# Patient Record
Sex: Male | Born: 1970 | Race: White | Hispanic: No | Marital: Single | Smoking: Former smoker
Health system: Southern US, Community
[De-identification: ages and names within clinical notes are randomized; demographics above are authoritative.]

## PROBLEM LIST (undated history)

## (undated) DIAGNOSIS — Z86010 Personal history of colonic polyps: Principal | ICD-10-CM

## (undated) DIAGNOSIS — K219 Gastro-esophageal reflux disease without esophagitis: Secondary | ICD-10-CM

## (undated) DIAGNOSIS — M722 Plantar fascial fibromatosis: Secondary | ICD-10-CM

## (undated) DIAGNOSIS — M549 Dorsalgia, unspecified: Secondary | ICD-10-CM

## (undated) DIAGNOSIS — K602 Anal fissure, unspecified: Secondary | ICD-10-CM

## (undated) DIAGNOSIS — Z9109 Other allergy status, other than to drugs and biological substances: Secondary | ICD-10-CM

## (undated) HISTORY — DX: Other allergy status, other than to drugs and biological substances: Z91.09

## (undated) HISTORY — DX: Anal fissure, unspecified: K60.2

## (undated) HISTORY — DX: Personal history of colonic polyps: Z86.010

## (undated) HISTORY — DX: Dorsalgia, unspecified: M54.9

## (undated) HISTORY — DX: Gastro-esophageal reflux disease without esophagitis: K21.9

## (undated) HISTORY — DX: Plantar fascial fibromatosis: M72.2

## (undated) HISTORY — PX: COLONOSCOPY: SHX174

---

## 2002-08-18 ENCOUNTER — Ambulatory Visit (HOSPITAL_COMMUNITY): Admission: RE | Admit: 2002-08-18 | Discharge: 2002-08-18 | Payer: Self-pay | Admitting: Gastroenterology

## 2011-12-19 LAB — PSA

## 2012-07-07 ENCOUNTER — Encounter: Payer: Self-pay | Admitting: Internal Medicine

## 2013-03-03 ENCOUNTER — Encounter: Payer: Self-pay | Admitting: Internal Medicine

## 2014-11-20 DIAGNOSIS — Z800 Family history of malignant neoplasm of digestive organs: Secondary | ICD-10-CM | POA: Insufficient documentation

## 2014-11-20 DIAGNOSIS — E785 Hyperlipidemia, unspecified: Secondary | ICD-10-CM | POA: Insufficient documentation

## 2014-11-22 ENCOUNTER — Encounter: Payer: Self-pay | Admitting: Unknown Physician Specialty

## 2014-11-22 ENCOUNTER — Ambulatory Visit (INDEPENDENT_AMBULATORY_CARE_PROVIDER_SITE_OTHER): Payer: BLUE CROSS/BLUE SHIELD | Admitting: Unknown Physician Specialty

## 2014-11-22 VITALS — BP 142/90 | HR 70 | Temp 98.9°F | Ht 70.5 in | Wt 216.2 lb

## 2014-11-22 DIAGNOSIS — Z800 Family history of malignant neoplasm of digestive organs: Secondary | ICD-10-CM | POA: Diagnosis not present

## 2014-11-22 DIAGNOSIS — M545 Low back pain: Secondary | ICD-10-CM

## 2014-11-22 DIAGNOSIS — Z23 Encounter for immunization: Secondary | ICD-10-CM

## 2014-11-22 DIAGNOSIS — E785 Hyperlipidemia, unspecified: Secondary | ICD-10-CM

## 2014-11-22 DIAGNOSIS — Z0000 Encounter for general adult medical examination without abnormal findings: Secondary | ICD-10-CM

## 2014-11-22 MED ORDER — CYCLOBENZAPRINE HCL 10 MG PO TABS: 10.0000 mg | ORAL_TABLET | Freq: Every day | ORAL | Status: DC

## 2014-11-22 MED ORDER — PRAVASTATIN SODIUM 40 MG PO TABS: 40.0000 mg | ORAL_TABLET | Freq: Every day | ORAL | Status: DC

## 2014-11-22 NOTE — Patient Instructions (Signed)
DASH Eating Plan  DASH stands for "Dietary Approaches to Stop Hypertension." The DASH eating plan is a healthy eating plan that has been shown to reduce high blood pressure (hypertension). Additional health benefits may include reducing the risk of type 2 diabetes mellitus, heart disease, and stroke. The DASH eating plan may also help with weight loss.  WHAT DO I NEED TO KNOW ABOUT THE DASH EATING PLAN?  For the DASH eating plan, you will follow these general guidelines:  · Choose foods with a percent daily value for sodium of less than 5% (as listed on the food label).  · Use salt-free seasonings or herbs instead of table salt or sea salt.  · Check with your health care provider or pharmacist before using salt substitutes.  · Eat lower-sodium products, often labeled as "lower sodium" or "no salt added."  · Eat fresh foods.  · Eat more vegetables, fruits, and low-fat dairy products.  · Choose whole grains. Look for the word "whole" as the first word in the ingredient list.  · Choose fish and skinless chicken or turkey more often than red meat. Limit fish, poultry, and meat to 6 oz (170 g) each day.  · Limit sweets, desserts, sugars, and sugary drinks.  · Choose heart-healthy fats.  · Limit cheese to 1 oz (28 g) per day.  · Eat more home-cooked food and less restaurant, buffet, and fast food.  · Limit fried foods.  · Cook foods using methods other than frying.  · Limit canned vegetables. If you do use them, rinse them well to decrease the sodium.  · When eating at a restaurant, ask that your food be prepared with less salt, or no salt if possible.  WHAT FOODS CAN I EAT?  Seek help from a dietitian for individual calorie needs.  Grains  Whole grain or whole wheat bread. Brown rice. Whole grain or whole wheat pasta. Quinoa, bulgur, and whole grain cereals. Low-sodium cereals. Corn or whole wheat flour tortillas. Whole grain cornbread. Whole grain crackers. Low-sodium crackers.  Vegetables  Fresh or frozen vegetables  (raw, steamed, roasted, or grilled). Low-sodium or reduced-sodium tomato and vegetable juices. Low-sodium or reduced-sodium tomato sauce and paste. Low-sodium or reduced-sodium canned vegetables.   Fruits  All fresh, canned (in natural juice), or frozen fruits.  Meat and Other Protein Products  Ground beef (85% or leaner), grass-fed beef, or beef trimmed of fat. Skinless chicken or turkey. Ground chicken or turkey. Pork trimmed of fat. All fish and seafood. Eggs. Dried beans, peas, or lentils. Unsalted nuts and seeds. Unsalted canned beans.  Dairy  Low-fat dairy products, such as skim or 1% milk, 2% or reduced-fat cheeses, low-fat ricotta or cottage cheese, or plain low-fat yogurt. Low-sodium or reduced-sodium cheeses.  Fats and Oils  Tub margarines without trans fats. Light or reduced-fat mayonnaise and salad dressings (reduced sodium). Avocado. Safflower, olive, or canola oils. Natural peanut or almond butter.  Other  Unsalted popcorn and pretzels.  The items listed above may not be a complete list of recommended foods or beverages. Contact your dietitian for more options.  WHAT FOODS ARE NOT RECOMMENDED?  Grains  White bread. White pasta. White rice. Refined cornbread. Bagels and croissants. Crackers that contain trans fat.  Vegetables  Creamed or fried vegetables. Vegetables in a cheese sauce. Regular canned vegetables. Regular canned tomato sauce and paste. Regular tomato and vegetable juices.  Fruits  Dried fruits. Canned fruit in light or heavy syrup. Fruit juice.  Meat and Other Protein   Products  Fatty cuts of meat. Ribs, chicken wings, bacon, sausage, bologna, salami, chitterlings, fatback, hot dogs, bratwurst, and packaged luncheon meats. Salted nuts and seeds. Canned beans with salt.  Dairy  Whole or 2% milk, cream, half-and-half, and cream cheese. Whole-fat or sweetened yogurt. Full-fat cheeses or blue cheese. Nondairy creamers and whipped toppings. Processed cheese, cheese spreads, or cheese  curds.  Condiments  Onion and garlic salt, seasoned salt, table salt, and sea salt. Canned and packaged gravies. Worcestershire sauce. Tartar sauce. Barbecue sauce. Teriyaki sauce. Soy sauce, including reduced sodium. Steak sauce. Fish sauce. Oyster sauce. Cocktail sauce. Horseradish. Ketchup and mustard. Meat flavorings and tenderizers. Bouillon cubes. Hot sauce. Tabasco sauce. Marinades. Taco seasonings. Relishes.  Fats and Oils  Butter, stick margarine, lard, shortening, ghee, and bacon fat. Coconut, palm kernel, or palm oils. Regular salad dressings.  Other  Pickles and olives. Salted popcorn and pretzels.  The items listed above may not be a complete list of foods and beverages to avoid. Contact your dietitian for more information.  WHERE CAN I FIND MORE INFORMATION?  National Heart, Lung, and [employer] Institute: www.nhlbi.nih.gov/health/health-topics/topics/dash/     This information is not intended to replace advice given to you by your health care provider. Make sure you discuss any questions you have with your health care provider.     Document Released: 01/09/2011 Document Revised: 02/10/2014 Document Reviewed: 11/24/2012  Elsevier Interactive Patient Education ©2016 Elsevier Inc.

## 2014-11-22 NOTE — Progress Notes (Signed)
BP 142/90 mmHg  Pulse 70  Temp(Src) 98.9 F (37.2 C)  Ht 5' 10.5" (1.791 m)  Wt 216 lb 3.2 oz (98.068 kg)  BMI 30.57 kg/m2  SpO2 97%   Subjective:    Patient ID: George Chang, male    DOB: 04/07/1970, 44 y.o.   MRN: [id number]  HPI: George Chang is a 44 y.o. male  Chief Complaint  Patient presents with  . Establish Care   Pt is here to re establish care.  He had been on Prvastatin in the past and hasn't been back.  He recently went to a work health screen and had a cholesterol of over 500.  He did not bring in the paper today.  He states high cholesterol runs in the family.  No heart disease that he knows of. No chest pain or SOB.  Wants to restart Pravachol as he had "no side effects" from that medication.  This has been a concern for quite some time.  Planning on losing weight.     His dad died at 65 of colon cancer.  He did get a colonoscopy about 7 years ago and is pretty sure he is due for another.  He would like to go to [place].      Relevant past medical, surgical, family and social history reviewed and updated as indicated. Interim medical history since our last visit reviewed. Allergies and medications reviewed and updated.  Review of Systems  Constitutional: Negative.   HENT: Negative.   Eyes: Negative.   Respiratory: Negative.   Cardiovascular: Negative.   Gastrointestinal: Negative.   Endocrine: Negative.   Genitourinary: Negative.   Musculoskeletal: Positive for back pain.       Pulled back at work lifting a lot of things.  States it gets stiff after sitting.  This has been going off and on for about a week but seems to be chronic in nature but pt not wanting to try PT at this time.   Skin: Negative.   Allergic/Immunologic: Negative.   Neurological: Negative.   Hematological: Negative.   Psychiatric/Behavioral: Negative.     Per HPI unless specifically indicated above     Objective:    BP 142/90 mmHg  Pulse 70  Temp(Src) 98.9 F (37.2  C)  Ht 5' 10.5" (1.791 m)  Wt 216 lb 3.2 oz (98.068 kg)  BMI 30.57 kg/m2  SpO2 97%  Wt Readings from Last 3 Encounters:  11/22/14 216 lb 3.2 oz (98.068 kg)  12/19/11 203 lb (92.08 kg)    Physical Exam  Constitutional: He is oriented to person, place, and time. He appears well-developed and well-nourished.  HENT:  Head: Normocephalic.  Eyes: Pupils are equal, round, and reactive to light.  Cardiovascular: Normal rate, regular rhythm and normal heart sounds.   Pulmonary/Chest: Effort normal.  Abdominal: Soft. Bowel sounds are normal.  Musculoskeletal: Normal range of motion.  Neurological: He is alert and oriented to person, place, and time. He has normal reflexes.  Skin: Skin is warm and dry.  Psychiatric: He has a normal mood and affect. His behavior is normal. Judgment and thought content normal.    Results for orders placed or performed in visit on 11/20/14  PSA  Result Value Ref Range   PSA from PP       Assessment & Plan:   Problem List Items Addressed This Visit      Unprioritized   Hyperlipidemia   Relevant Orders   TSH    Other Visit  Diagnoses    Immunization due    -  Primary    Relevant Orders    Flu Vaccine QUAD 36+ mos PF IM (Fluarix & Fluzone Quad PF) (Completed)    Annual physical exam        Relevant Orders    CBC    Comprehensive metabolic panel    TSH    Lipid Panel Piccolo, Waived    PSA    HIV antibody    Low back pain without sciatica, unspecified back pain laterality        Rx for Flexeril    Relevant Medications    ibuprofen (ADVIL,MOTRIN) 200 MG tablet    cyclobenzaprine (FLEXERIL) 10 MG tablet        Follow up plan: No Follow-up on file.

## 2014-11-22 NOTE — Assessment & Plan Note (Signed)
Will start Pravastatin and check cholesterol today as a baseline.  Sounds like familial hypercholesteroemia

## 2014-11-23 LAB — COMPREHENSIVE METABOLIC PANEL
ALBUMIN: 4.6 g/dL (ref 3.5–5.5)
ALK PHOS: 77 IU/L (ref 39–117)
ALT: 64 IU/L — ABNORMAL HIGH (ref 0–44)
AST: 38 IU/L (ref 0–40)
Albumin/Globulin Ratio: 1.6 (ref 1.1–2.5)
BILIRUBIN TOTAL: 0.5 mg/dL (ref 0.0–1.2)
BUN / CREAT RATIO: 15 (ref 9–20)
BUN: 17 mg/dL (ref 6–24)
CHLORIDE: 100 mmol/L (ref 97–106)
CO2: 23 mmol/L (ref 18–29)
Calcium: 9.3 mg/dL (ref 8.7–10.2)
Creatinine, Ser: 1.14 mg/dL (ref 0.76–1.27)
GFR calc Af Amer: 89 mL/min/{1.73_m2} (ref >59)
GFR calc non Af Amer: 77 mL/min/{1.73_m2} (ref >59)
GLOBULIN, TOTAL: 2.9 g/dL (ref 1.5–4.5)
GLUCOSE: 93 mg/dL (ref 65–99)
Potassium: 3.9 mmol/L (ref 3.5–5.2)
SODIUM: 139 mmol/L (ref 136–144)
Total Protein: 7.5 g/dL (ref 6.0–8.5)

## 2014-11-23 LAB — CBC
HEMATOCRIT: 40.6 % (ref 37.5–51.0)
HEMOGLOBIN: 14.5 g/dL (ref 12.6–17.7)
MCH: 30.2 pg (ref 26.6–33.0)
MCHC: 35.7 g/dL (ref 31.5–35.7)
MCV: 85 fL (ref 79–97)
Platelets: 180 10*3/uL (ref 150–379)
RBC: 4.8 x10E6/uL (ref 4.14–5.80)
RDW: 14 % (ref 12.3–15.4)
WBC: 5.4 10*3/uL (ref 3.4–10.8)

## 2014-11-23 LAB — LIPID PANEL W/O CHOL/HDL RATIO
CHOLESTEROL TOTAL: 269 mg/dL — AB (ref 100–199)
HDL: 33 mg/dL — ABNORMAL LOW (ref >39)
LDL CALC: 158 mg/dL — AB (ref 0–99)
TRIGLYCERIDES: 390 mg/dL — AB (ref 0–149)
VLDL CHOLESTEROL CAL: 78 mg/dL — AB (ref 5–40)

## 2014-11-23 LAB — TSH: TSH: 2.79 u[IU]/mL (ref 0.450–4.500)

## 2014-11-23 LAB — PSA: Prostate Specific Ag, Serum: 0.5 ng/mL (ref 0.0–4.0)

## 2014-11-23 LAB — HIV ANTIBODY (ROUTINE TESTING W REFLEX): HIV Screen 4th Generation wRfx: NONREACTIVE

## 2014-11-23 LAB — SPECIMEN STATUS REPORT

## 2014-11-27 ENCOUNTER — Telehealth: Payer: Self-pay | Admitting: Family Medicine

## 2014-11-27 NOTE — Telephone Encounter (Signed)
Please let him know that his labs were normal except for his cholesterol, which was elevated as expected. He should start his pravastatin and George Chang will recheck it at his follow up appointment.

## 2014-11-27 NOTE — Telephone Encounter (Signed)
Patient called back and notified. Patient had no other questions.

## 2014-11-27 NOTE — Telephone Encounter (Signed)
Called patient to let him know results. No answer. Left VM for patient to return my call.

## 2014-12-07 ENCOUNTER — Encounter: Payer: Self-pay | Admitting: Internal Medicine

## 2014-12-19 ENCOUNTER — Other Ambulatory Visit: Payer: Self-pay | Admitting: Unknown Physician Specialty

## 2015-01-24 ENCOUNTER — Ambulatory Visit (AMBULATORY_SURGERY_CENTER): Payer: Self-pay

## 2015-01-24 ENCOUNTER — Telehealth: Payer: Self-pay | Admitting: Unknown Physician Specialty

## 2015-01-24 VITALS — Ht 71.0 in | Wt 220.2 lb

## 2015-01-24 DIAGNOSIS — Z1211 Encounter for screening for malignant neoplasm of colon: Secondary | ICD-10-CM

## 2015-01-24 NOTE — Telephone Encounter (Signed)
Call pt   Expand All Collapse All   Called patient and he states that he was put on pravastatin at his last visit, about a month and a half ago. States he was put on it a few years ago and did not have any issues so he wanted to start back on it. He states that ever since he has started it, his stools have been extremely hard to the point where they are ripping his rectum. Patient states he is supposed to have colonoscopy pre-op appointment today and doesn't know if the issue he has going on will effect it. Patient states he has thought about taking a stool softener but doesn't like taking extra pills. Patient wants to know if he can be switched to something different or what he should do. [facility]acy is [facility] on [address].

## 2015-01-24 NOTE — Progress Notes (Signed)
No allergies to eggs or soy No home oxygen No past problems with anesthesia No diet/weight loss meds  Has email and internet; refused emmi

## 2015-01-24 NOTE — Telephone Encounter (Signed)
Pt would like a call back about a complication he is having, didn't give details sorry.

## 2015-01-24 NOTE — Telephone Encounter (Signed)
Called patient and he states that he was put on pravastatin at his last visit, about a month and a half ago. States he was put on it a few years ago and did not have any issues so he wanted to start back on it. He states that ever since he has started it, his stools have been extremely hard to the point where they are ripping his rectum. Patient states he is supposed to have colonoscopy pre-op appointment today and doesn't know if the issue he has going on will effect it. Patient states he has thought about taking a stool softener but doesn't like taking extra pills. Patient wants to know if he can be switched to something different or what he should do. [facility] [address].

## 2015-02-07 ENCOUNTER — Encounter: Payer: Self-pay | Admitting: Internal Medicine

## 2015-02-07 ENCOUNTER — Ambulatory Visit (AMBULATORY_SURGERY_CENTER): Payer: BLUE CROSS/BLUE SHIELD | Admitting: Internal Medicine

## 2015-02-07 VITALS — BP 119/75 | HR 57 | Temp 96.3°F | Resp 50 | Ht 71.0 in | Wt 220.0 lb

## 2015-02-07 DIAGNOSIS — K601 Chronic anal fissure: Secondary | ICD-10-CM

## 2015-02-07 DIAGNOSIS — D124 Benign neoplasm of descending colon: Secondary | ICD-10-CM | POA: Diagnosis not present

## 2015-02-07 DIAGNOSIS — Z1211 Encounter for screening for malignant neoplasm of colon: Secondary | ICD-10-CM | POA: Diagnosis present

## 2015-02-07 DIAGNOSIS — Z800 Family history of malignant neoplasm of digestive organs: Secondary | ICD-10-CM | POA: Diagnosis not present

## 2015-02-07 MED ORDER — DILTIAZEM GEL 2 %: 1.0000 "application " | Freq: Two times a day (BID) | CUTANEOUS | Status: DC

## 2015-02-07 MED ORDER — SODIUM CHLORIDE 0.9 % IV SOLN: 500.0000 mL | INTRAVENOUS | Status: DC

## 2015-02-07 NOTE — Op Note (Signed)
[facility]  [address] [place], [id number]   COLONOSCOPY PROCEDURE REPORT  PATIENT: George Chang, George Chang  MR#: [id number] BIRTHDATE: 09-18-1970 , 44  yrs. old GENDER: male ENDOSCOPIST: Gatha Mayer, MD, [facility] PROCEDURE DATE:  02/07/2015 PROCEDURE:   Colonoscopy, screening and Colonoscopy with snare polypectomy First Screening Colonoscopy - Avg.  risk and is 50 yrs.  old or older - No.  Prior Negative Screening - Now for repeat screening. 10 or more years since last screening  History of Adenoma - Now for follow-up colonoscopy & has been > or = to 3 yrs.  N/A  Polyps removed today? Yes ASA CLASS:   Class II INDICATIONS:Screening for colonic neoplasia and FH Colon or Rectal Adenocarcinoma. MEDICATIONS: Propofol 350 mg IV and Monitored anesthesia care  DESCRIPTION OF PROCEDURE:   After the risks benefits and alternatives of the procedure were thoroughly explained, informed consent was obtained.  The digital rectal exam revealed no prostatic nodules, revealed the prostate was not enlarged, and revealed a chronic anal fissure.   The LB [id number] endoscope was introduced through the anus and advanced to the cecum, which was identified by both the appendix and ileocecal valve. No adverse events experienced.   The quality of the prep was excellent.  (MiraLax was used)  The instrument was then slowly withdrawn as the colon was fully examined. Estimated blood loss is zero unless otherwise noted in this procedure report.      COLON FINDINGS: A smooth sessile polyp measuring 8 mm in size was found in the descending colon.  A polypectomy was performed with a cold snare.  The resection was complete, the polyp tissue was completely retrieved and sent to histology.   The examination was otherwise normal.   An anal fissure was found in the anal canal. Retroflexed views revealed no abnormalities. The time to cecum = 5.5 Withdrawal time = 15.2   The scope was  withdrawn and the procedure completed. COMPLICATIONS: There were no immediate complications.  ENDOSCOPIC IMPRESSION: 1.   Sessile polyp was found in the descending colon; polypectomy was performed with a cold snare 2.   The examination was otherwise normal 3.   Anal fissure in the anal canal  RECOMMENDATIONS: 1.  Timing of repeat colonoscopy will be determined by pathology findings. 2.  Diltiazem gel 2% bid to fissure 3.  Benefiber 2 tbsp a day for fissure 4.  If fissure not resolved after treatment x 3-4 months (take diltiazem for 1 month after feeling well) would see a surgeon  eSigned:  Gatha Mayer, MD, [facility] 02/07/2015 2:19 PM   cc: The Patient and Kathrine Haddock, NP

## 2015-02-07 NOTE — Progress Notes (Signed)
Called to room to assist during endoscopic procedure.  Patient ID and intended procedure confirmed with present staff. Received instructions for my participation in the procedure from the performing physician.  

## 2015-02-07 NOTE — Progress Notes (Signed)
Pt. Unable to pass enough air to become comfortable.  He is distended, firm and uncomfortable.  Dr. Carlean Purl in to pass rectal tube.  Pt. Did pass Air with tube.  Dr. Carlean Purl approves of discharge.

## 2015-02-07 NOTE — Progress Notes (Signed)
Patient denies any allergies to eggs or soy. 

## 2015-02-07 NOTE — Patient Instructions (Addendum)
I found and removed one polyp.  You do have an anal fissure - treat that with stool softeners/fiber - and diltiazem gel. Prescription needs to be taken to a compounding pharmacy. I think [facility] in [place] can do this.  I will let you know pathology results and when to have another routine colonoscopy by mail.  I appreciate the opportunity to care for you. Gatha Mayer, MD, FACG  YOU HAD AN ENDOSCOPIC PROCEDURE TODAY AT [facility]:   Refer to the procedure report that was given to you for any specific questions about what was found during the examination.  If the procedure report does not answer your questions, please call your gastroenterologist to clarify.  If you requested that your care partner not be given the details of your procedure findings, then the procedure report has been included in a sealed envelope for you to review at your convenience later.  YOU SHOULD EXPECT: Some feelings of bloating in the abdomen. Passage of more gas than usual.  Walking can help get rid of the air that was put into your GI tract during the procedure and reduce the bloating. If you had a lower endoscopy (such as a colonoscopy or flexible sigmoidoscopy) you may notice spotting of blood in your stool or on the toilet paper. If you underwent a bowel prep for your procedure, you may not have a normal bowel movement for a few days.  Please Note:  You might notice some irritation and congestion in your nose or some drainage.  This is from the oxygen used during your procedure.  There is no need for concern and it should clear up in a day or so.  SYMPTOMS TO REPORT IMMEDIATELY:   Following lower endoscopy (colonoscopy or flexible sigmoidoscopy):  Excessive amounts of blood in the stool  Significant tenderness or worsening of abdominal pains  Swelling of the abdomen that is new, acute  Fever of 100F or higher   Following upper endoscopy (EGD)  Vomiting of blood or coffee  ground material  New chest pain or pain under the shoulder blades  Painful or persistently difficult swallowing  New shortness of breath  Fever of 100F or higher  Black, tarry-looking stools  For urgent or emergent issues, a gastroenterologist can be reached at any hour by calling [phone].   DIET: Your first meal following the procedure should be a small meal and then it is ok to progress to your normal diet. Heavy or fried foods are harder to digest and may make you feel nauseous or bloated.  Likewise, meals heavy in dairy and vegetables can increase bloating.  Drink plenty of fluids but you should avoid alcoholic beverages for 24 hours.  ACTIVITY:  You should plan to take it easy for the rest of today and you should NOT DRIVE or use heavy machinery until tomorrow (because of the sedation medicines used during the test).    FOLLOW UP: Our staff will call the number listed on your records the next business day following your procedure to check on you and address any questions or concerns that you may have regarding the information given to you following your procedure. If we do not reach you, we will leave a message.  However, if you are feeling well and you are not experiencing any problems, there is no need to return our call.  We will assume that you have returned to your regular daily activities without incident.  If any biopsies  were taken you will be contacted by phone or by letter within the next 1-3 weeks.  Please call us at [phone] if you have not heard about the biopsies in 3 weeks.    SIGNATURES/CONFIDENTIALITY: You and/or your care partner have signed paperwork which will be entered into your electronic medical record.  These signatures attest to the fact that that the information above on your After Visit Summary has been reviewed and is understood.  Full responsibility of the confidentiality of this discharge information lies with you and/or your  care-partner.  Polyp information given. Anal fissure information given.  Benefiber 2 tablespoons a day for fiber. Diltiazem gel twice a day for anal fissure.  If fissure not resolved after treatment and 3-4 months (take diltiazem for 1 month sfter feeling well) you may want to see a surgeon.  Await pathology report on polyp.

## 2015-02-07 NOTE — Progress Notes (Signed)
A/ox3 pleased with MAC, report to Jane RN 

## 2015-02-08 ENCOUNTER — Telehealth: Payer: Self-pay | Admitting: *Deleted

## 2015-02-08 NOTE — Telephone Encounter (Signed)
  Follow up Call-  Call back number 02/07/2015  Post procedure Call Back phone  # [phone]  Permission to leave phone message Yes     Patient questions:  Do you have a fever, pain , or abdominal swelling? No. Pain Score  0 *  Have you tolerated food without any problems? Yes.    Have you been able to return to your normal activities? Yes.    Do you have any questions about your discharge instructions: Diet   No. Medications  No. Follow up visit  No.  Do you have questions or concerns about your Care? No.  Actions: * If pain score is 4 or above: No action needed, pain <4.

## 2015-02-14 ENCOUNTER — Encounter: Payer: Self-pay | Admitting: Internal Medicine

## 2015-02-14 DIAGNOSIS — K602 Anal fissure, unspecified: Secondary | ICD-10-CM | POA: Insufficient documentation

## 2015-02-14 DIAGNOSIS — Z860100 Personal history of colon polyps, unspecified: Secondary | ICD-10-CM

## 2015-02-14 HISTORY — DX: Anal fissure, unspecified: K60.2

## 2015-02-14 HISTORY — DX: Personal history of colon polyps, unspecified: Z86.0100

## 2015-02-14 HISTORY — DX: Personal history of colonic polyps: Z86.010

## 2015-02-14 NOTE — Progress Notes (Signed)
Quick Note:  8 mm ssp Repeat colon 2022 ______

## 2015-05-25 ENCOUNTER — Encounter: Payer: Self-pay | Admitting: Unknown Physician Specialty

## 2015-05-25 ENCOUNTER — Ambulatory Visit (INDEPENDENT_AMBULATORY_CARE_PROVIDER_SITE_OTHER): Payer: BLUE CROSS/BLUE SHIELD | Admitting: Unknown Physician Specialty

## 2015-05-25 VITALS — BP 126/84 | HR 69 | Temp 98.6°F | Ht 70.2 in | Wt 216.6 lb

## 2015-05-25 DIAGNOSIS — E785 Hyperlipidemia, unspecified: Secondary | ICD-10-CM

## 2015-05-25 MED ORDER — ATORVASTATIN CALCIUM 10 MG PO TABS: 10.0000 mg | ORAL_TABLET | Freq: Every day | ORAL | Status: DC

## 2015-05-25 NOTE — Assessment & Plan Note (Signed)
Trial of Atorvastatin 10 mg QD.

## 2015-05-25 NOTE — Progress Notes (Signed)
BP 126/84 mmHg  Pulse 69  Temp(Src) 98.6 F (37 C)  Ht 5' 10.2" (1.783 m)  Wt 216 lb 9.6 oz (98.249 kg)  BMI 30.90 kg/m2  SpO2 95%   Subjective:    Patient ID: George Chang, male    DOB: [birth year], 45 y.o.   MRN: [id number]  HPI: George Chang is a 45 y.o. male  Chief Complaint  Patient presents with  . Hyperlipidemia    Hyperlipidemia Not taking cholesterol medications.  He states he is not taking the cholesterol medications but it made his stools hard and developed a rectal fissure.  He tried it twice.  He is willing to try a different one.  He wonders if taking fish oil or flaxseed  Family History  Problem Relation Age of Onset  . Cancer Father     colon  . Colon cancer Father 50    expired at 75 as well  . Hyperlipidemia Mother      Relevant past medical, surgical, family and social history reviewed and updated as indicated. Interim medical history since our last visit reviewed. Allergies and medications reviewed and updated.  Review of Systems  Constitutional: Negative.   HENT: Negative.   Eyes: Negative.   Respiratory: Negative.   Cardiovascular: Negative.   Gastrointestinal: Negative.   Endocrine: Negative.   Genitourinary: Negative.   Skin: Negative.   Allergic/Immunologic: Negative.   Neurological: Negative.   Hematological: Negative.   Psychiatric/Behavioral: Negative.     Per HPI unless specifically indicated above     Objective:    BP 126/84 mmHg  Pulse 69  Temp(Src) 98.6 F (37 C)  Ht 5' 10.2" (1.783 m)  Wt 216 lb 9.6 oz (98.249 kg)  BMI 30.90 kg/m2  SpO2 95%  Wt Readings from Last 3 Encounters:  05/25/15 216 lb 9.6 oz (98.249 kg)  02/07/15 220 lb (99.791 kg)  01/24/15 220 lb 3.2 oz (99.882 kg)    Physical Exam  Constitutional: He is oriented to person, place, and time. He appears well-developed and well-nourished. No distress.  HENT:  Head: Normocephalic and atraumatic.  Eyes: Conjunctivae and lids are normal. Right eye  exhibits no discharge. Left eye exhibits no discharge. No scleral icterus.  Cardiovascular: Normal rate.   Pulmonary/Chest: Effort normal.  Abdominal: Normal appearance. There is no splenomegaly or hepatomegaly.  Musculoskeletal: Normal range of motion.  Neurological: He is alert and oriented to person, place, and time.  Skin: Skin is intact. No rash noted. No pallor.  Psychiatric: He has a normal mood and affect. His behavior is normal. Judgment and thought content normal.    Results for orders placed or performed in visit on 11/22/14  CBC  Result Value Ref Range   WBC 5.4 3.4 - 10.8 x10E3/uL   RBC 4.80 4.14 - 5.80 x10E6/uL   Hemoglobin 14.5 12.6 - 17.7 g/dL   Hematocrit 40.6 37.5 - 51.0 %   MCV 85 79 - 97 fL   MCH 30.2 26.6 - 33.0 pg   MCHC 35.7 31.5 - 35.7 g/dL   RDW 14.0 12.3 - 15.4 %   Platelets 180 150 - 379 x10E3/uL  Comprehensive metabolic panel  Result Value Ref Range   Glucose 93 65 - 99 mg/dL   BUN 17 6 - 24 mg/dL   Creatinine, Ser 1.14 0.76 - 1.27 mg/dL   GFR calc non Af Amer 77 >59 mL/min/1.73   GFR calc Af Amer 89 >59 mL/min/1.73   BUN/Creatinine Ratio 15 9 - 20  Sodium 139 136 - 144 mmol/L   Potassium 3.9 3.5 - 5.2 mmol/L   Chloride 100 97 - 106 mmol/L   CO2 23 18 - 29 mmol/L   Calcium 9.3 8.7 - 10.2 mg/dL   Total Protein 7.5 6.0 - 8.5 g/dL   Albumin 4.6 3.5 - 5.5 g/dL   Globulin, Total 2.9 1.5 - 4.5 g/dL   Albumin/Globulin Ratio 1.6 1.1 - 2.5   Bilirubin Total 0.5 0.0 - 1.2 mg/dL   Alkaline Phosphatase 77 39 - 117 IU/L   AST 38 0 - 40 IU/L   ALT 64 (H) 0 - 44 IU/L  TSH  Result Value Ref Range   TSH 2.790 0.450 - 4.500 uIU/mL  PSA  Result Value Ref Range   Prostate Specific Ag, Serum 0.5 0.0 - 4.0 ng/mL  HIV antibody  Result Value Ref Range   HIV Screen 4th Generation wRfx Non Reactive Non Reactive  Specimen status report  Result Value Ref Range   specimen status report Comment   Lipid Panel w/o Chol/HDL Ratio  Result Value Ref Range    Cholesterol, Total 269 (H) 100 - 199 mg/dL   Triglycerides 390 (H) 0 - 149 mg/dL   HDL 33 (L) >39 mg/dL   VLDL Cholesterol Cal 78 (H) 5 - 40 mg/dL   LDL Calculated 158 (H) 0 - 99 mg/dL      Assessment & Plan:   Problem List Items Addressed This Visit      Unprioritized   Hyperlipidemia - Primary    Trial of Atorvastatin 10 mg QD.        Relevant Medications   atorvastatin (LIPITOR) 10 MG tablet      Follow up plan: Return in about 3 months (around 08/24/2015).

## 2015-08-08 ENCOUNTER — Ambulatory Visit (INDEPENDENT_AMBULATORY_CARE_PROVIDER_SITE_OTHER): Payer: Worker's Compensation

## 2015-08-08 ENCOUNTER — Ambulatory Visit
Admission: EM | Admit: 2015-08-08 | Discharge: 2015-08-08 | Disposition: A | Discharge status: Home / Self Care | Payer: Worker's Compensation | Attending: Family Medicine | Admitting: Family Medicine

## 2015-08-08 DIAGNOSIS — M25521 Pain in right elbow: Secondary | ICD-10-CM

## 2015-08-08 DIAGNOSIS — S56911A Strain of unspecified muscles, fascia and tendons at forearm level, right arm, initial encounter: Secondary | ICD-10-CM

## 2015-08-08 DIAGNOSIS — S46911A Strain of unspecified muscle, fascia and tendon at shoulder and upper arm level, right arm, initial encounter: Secondary | ICD-10-CM

## 2015-08-08 MED ORDER — ORPHENADRINE CITRATE ER 100 MG PO TB12: 100.0000 mg | ORAL_TABLET | Freq: Two times a day (BID) | ORAL | Status: DC

## 2015-08-08 MED ORDER — MELOXICAM 15 MG PO TABS: 15.0000 mg | ORAL_TABLET | Freq: Every day | ORAL | Status: DC

## 2015-08-08 NOTE — ED Notes (Signed)
Patient presents with right elbow pain. He states that he picked up a microwave and placed it on a table, and a few minutes later it started having pain in his elbow.

## 2015-08-08 NOTE — ED Provider Notes (Signed)
CSN: [id number]     Arrival date & time 08/08/15  1518 History   First MD Initiated Contact with Patient 08/08/15 1537    Nurses notes were reviewed. Chief Complaint  Patient presents with  . Arm Injury    Right Elbow   R elbow pain . Patient states that he was lifting a microwave work and when you went a few feet he started feeling some pain. Likely down and the pain started getting worse. The pain is discrete shading and goes up the medial aspect of his right elbow. He denies any other injury other than lifting the microwave. He states is unable to straighten out his elbow now because of the pain. If he keeps his elbow flexed the pain is bearable many tries to straighten on his elbow he has pain. Past history hyperlipidemia. No history of hypertension but he does have a history of colonic polyps in his father had colon cancer and died at age 97 he is a former smoker and he is allergic to Pravachol.  No previous surgeries other than colonoscopy.    (Consider location/radiation/quality/duration/timing/severity/associated sxs/prior Treatment) HPI  Past Medical History  Diagnosis Date  . Environmental allergies   . Back pain   . GERD (gastroesophageal reflux disease)   . Personal history of colonic polyps 02/14/2015  . Anal fissure 02/14/2015  . Plantar fasciitis    Past Surgical History  Procedure Laterality Date  . Colonoscopy     Family History  Problem Relation Age of Onset  . Cancer Father     colon  . Colon cancer Father 50    expired at 65 as well  . Hyperlipidemia Mother    Social History  Substance Use Topics  . Smoking status: Former Smoker    Quit date: 10/07/1996  . Smokeless tobacco: Never Used  . Alcohol Use: 7.2 oz/week    12 Cans of beer, 0 Standard drinks or equivalent per week    Review of Systems  All other systems reviewed and are negative.   Allergies  Pravastatin  Home Medications   Prior to Admission medications   Medication Sig Start Date End  Date Taking? Authorizing Provider  atorvastatin (LIPITOR) 10 MG tablet Take 1 tablet (10 mg total) by mouth daily. 05/25/15  Yes Kathrine Haddock, NP  calcium carbonate (TUMS - DOSED IN MG ELEMENTAL CALCIUM) 500 MG chewable tablet Chew 1 tablet by mouth daily. Reported on 02/07/2015   Yes Historical Provider, MD  ibuprofen (ADVIL,MOTRIN) 200 MG tablet Take 200 mg by mouth daily as needed.   Yes Historical Provider, MD  cyclobenzaprine (FLEXERIL) 10 MG tablet TAKE 1 TABLET (10 MG TOTAL) BY MOUTH AT BEDTIME. Patient taking differently: TAKE 1 TABLET (10 MG TOTAL) BY MOUTH AT BEDTIME. PRN 12/20/14   Kathrine Haddock, NP  diltiazem 2 % GEL Apply 1 application topically 2 (two) times daily. Apply inside rectum, insert to first knuckle 02/07/15   Gatha Mayer, MD  Flaxseed, Linseed, (FLAX SEEDS PO) Take by mouth.    Historical Provider, MD  meloxicam (MOBIC) 15 MG tablet Take 15 mg by mouth daily. 05/21/15   Historical Provider, MD  meloxicam (MOBIC) 15 MG tablet Take 1 tablet (15 mg total) by mouth daily. 08/08/15   Frederich Cha, MD  Omega-3 Fatty Acids (FISH OIL) 1000 MG CAPS Take by mouth. Take 2 tablets by mouth daily    Historical Provider, MD  orphenadrine (NORFLEX) 100 MG tablet Take 1 tablet (100 mg total) by mouth 2 (two)  times daily. 08/08/15   Frederich Cha, MD   Meds Ordered and Administered this Visit  Medications - No data to display  BP 154/91 mmHg  Pulse 67  Temp(Src) 98.3 F (36.8 C) (Oral)  Resp 18  Ht 6' (1.829 m)  Wt 220 lb (99.791 kg)  BMI 29.83 kg/m2  SpO2 97% No data found.   Physical Exam  Constitutional: He is oriented to person, place, and time. He appears well-developed and well-nourished.  HENT:  Head: Normocephalic and atraumatic.  Eyes: Conjunctivae are normal. Pupils are equal, round, and reactive to light.  Neck: Neck supple.  Musculoskeletal: He exhibits tenderness.       Right elbow: He exhibits decreased range of motion and swelling. He exhibits no deformity and no  laceration. Tenderness found. Radial head tenderness noted.  Tenderness over the medial distal humerus aspect of the elbow. Attempts made to straighten and to extend the elbow and unable to do to patient's resistance to straighten elbow because of pain  Neurological: He is alert and oriented to person, place, and time. No cranial nerve deficit.  Skin: Skin is warm and dry.  Psychiatric: He has a normal mood and affect. His behavior is normal.  Vitals reviewed.   ED Course  Procedures (including critical care time)  Labs Review Labs Reviewed - No data to display  Imaging Review Dg Elbow Complete Right  08/08/2015  CLINICAL DATA:  Right posterior elbow pain. Patient is unable to extend elbow/arm completely. EXAM: RIGHT ELBOW - COMPLETE 3+ VIEW COMPARISON:  None. FINDINGS: There is no evidence of fracture, dislocation, or joint effusion. There is no evidence of arthropathy or other focal bone abnormality. Soft tissues are unremarkable. IMPRESSION: Negative. Electronically Signed   By: Kerby Moors M.D.   On: 08/08/2015 16:27     Visual Acuity Review  Right Eye Distance:   Left Eye Distance:   Bilateral Distance:    Right Eye Near:   Left Eye Near:    Bilateral Near:         MDM   1. Elbow joint pain, right   2. Elbow strain, right, initial encounter      We'll x-ray the right elbow.X-ray shows no signs of fractures or dislocation we'll place him in a sling have use ice for the right elbow and will follow-up with Charna Archer for further evaluation sometime next week. Until he sees her will restrict use of the right arm at work and no lifting more than 25 pounds. Neurological back to work tomorrow.   Note: This dictation was prepared with Dragon dictation along with smaller phrase technology. Any transcriptional errors that result from this process are unintentional.    Frederich Cha, MD 08/08/15 [phone]

## 2015-08-08 NOTE — Discharge Instructions (Signed)
Cryotherapy Cryotherapy is when you put ice on your injury. Ice helps lessen pain and puffiness (swelling) after an injury. Ice works the best when you start using it in the first 24 to 48 hours after an injury. HOME CARE  Put a dry or damp towel between the ice pack and your skin.  You may press gently on the ice pack.  Leave the ice on for no more than 10 to 20 minutes at a time.  Check your skin after 5 minutes to make sure your skin is okay.  Rest at least 20 minutes between ice pack uses.  Stop using ice when your skin loses feeling (numbness).  Do not use ice on someone who cannot tell you when it hurts. This includes small children and people with memory problems (dementia). GET HELP RIGHT AWAY IF:  You have white spots on your skin.  Your skin turns blue or pale.  Your skin feels waxy or hard.  Your puffiness gets worse. MAKE SURE YOU:   Understand these instructions.  Will watch your condition.  Will get help right away if you are not doing well or get worse.   This information is not intended to replace advice given to you by your health care provider. Make sure you discuss any questions you have with your health care provider.   Document Released: 07/09/2007 Document Revised: 04/14/2011 Document Reviewed: 09/12/2010 Elsevier Interactive Patient Education 2016 [employer].  Joint Pain Joint pain can be caused by many things. The joint can be bruised, infected, weak from aging, or sore from exercise. The pain will probably go away if you follow your doctor's instructions for home care. If your joint pain continues, more tests may be needed to help find the cause of your condition. HOME CARE Watch your condition for any changes. Follow these instructions as told to lessen the pain that you are feeling:  Take medicines only as told by your doctor.  Rest the sore joint for as long as told by your doctor. If your doctor tells you to, raise (elevate) the painful  joint above the level of your heart while you are sitting or lying down.  Do not do things that cause pain or make the pain worse.  If told, put ice on the painful area:  Put ice in a plastic bag.  Place a towel between your skin and the bag.  Leave the ice on for 20 minutes, 2-3 times per day.  Wear an elastic bandage, splint, or sling as told by your doctor. Loosen the bandage or splint if your fingers or toes lose feeling (become numb) and tingle, or if they turn cold and blue.  Begin exercising or stretching the joint as told by your doctor. Ask your doctor what types of exercise are safe for you.  Keep all follow-up visits as told by your doctor. This is important. GET HELP IF:  Your pain gets worse and medicine does not help it.  Your joint pain does not get better in 3 days.  You have more bruising or swelling.  You have a fever.  You lose 10 pounds (4.5 kg) or more without trying. GET HELP RIGHT AWAY IF:  You are not able to move the joint.  Your fingers or toes become numb or they turn cold and blue.   This information is not intended to replace advice given to you by your health care provider. Make sure you discuss any questions you have with your health care  provider.   Document Released: 01/08/2009 Document Revised: 02/10/2014 Document Reviewed: 11/01/2013 Elsevier Interactive Patient Education 2016 [employer].  Muscle Strain A muscle strain (pulled muscle) happens when a muscle is stretched beyond normal length. It happens when a sudden, violent force stretches your muscle too far. Usually, a few of the fibers in your muscle are torn. Muscle strain is common in athletes. Recovery usually takes 1-2 weeks. Complete healing takes 5-6 weeks.  HOME CARE   Follow the PRICE method of treatment to help your injury get better. Do this the first 2-3 days after the injury:  Protect. Protect the muscle to keep it from getting injured again.  Rest. Limit your  activity and rest the injured body part.  Ice. Put ice in a plastic bag. Place a towel between your skin and the bag. Then, apply the ice and leave it on from 15-20 minutes each hour. After the third day, switch to moist heat packs.  Compression. Use a splint or elastic bandage on the injured area for comfort. Do not put it on too tightly.  Elevate. Keep the injured body part above the level of your heart.  Only take medicine as told by your doctor.  Warm up before doing exercise to prevent future muscle strains. GET HELP IF:   You have more pain or puffiness (swelling) in the injured area.  You feel numbness, tingling, or notice a loss of strength in the injured area. MAKE SURE YOU:   Understand these instructions.  Will watch your condition.  Will get help right away if you are not doing well or get worse.   This information is not intended to replace advice given to you by your health care provider. Make sure you discuss any questions you have with your health care provider.   Document Released: 10/30/2007 Document Revised: 11/10/2012 Document Reviewed: 08/19/2012 Elsevier Interactive Patient Education [employer].

## 2015-08-27 ENCOUNTER — Encounter: Payer: Self-pay | Admitting: Unknown Physician Specialty

## 2015-08-27 ENCOUNTER — Ambulatory Visit (INDEPENDENT_AMBULATORY_CARE_PROVIDER_SITE_OTHER): Payer: BLUE CROSS/BLUE SHIELD | Admitting: Unknown Physician Specialty

## 2015-08-27 VITALS — BP 127/79 | HR 81 | Temp 98.6°F | Ht 72.0 in | Wt 222.0 lb

## 2015-08-27 DIAGNOSIS — E785 Hyperlipidemia, unspecified: Secondary | ICD-10-CM

## 2015-08-27 DIAGNOSIS — E669 Obesity, unspecified: Secondary | ICD-10-CM | POA: Diagnosis not present

## 2015-08-27 NOTE — Assessment & Plan Note (Signed)
Unable to read Lipid panel here.  Await results from [facility]

## 2015-08-27 NOTE — Assessment & Plan Note (Signed)
Discussed exercise.

## 2015-08-27 NOTE — Progress Notes (Signed)
BP 127/79 (BP Location: Left Arm, Cuff Size: Large)   Pulse 81   Temp 98.6 F (37 C)   Ht 6' (1.829 m)   Wt 222 lb (100.7 kg)   SpO2 95%   BMI 30.11 kg/m    Subjective:    Patient ID: George Chang, male    DOB: [birth year], 45 y.o.   MRN: [id number]  HPI: George Chang is a 45 y.o. male  Chief Complaint  Patient presents with  . Hyperlipidemia   Hyperlipidemia Doing well with Atorvastatin daily.   Using medications without problems: No Muscle aches  Diet compliance:  Exercise: Started exercising but is having trouble getting going due to plantar faciitis.     Relevant past medical, surgical, family and social history reviewed and updated as indicated. Interim medical history since our last visit reviewed. Allergies and medications reviewed and updated.  Review of Systems  Per HPI unless specifically indicated above     Objective:    BP 127/79 (BP Location: Left Arm, Cuff Size: Large)   Pulse 81   Temp 98.6 F (37 C)   Ht 6' (1.829 m)   Wt 222 lb (100.7 kg)   SpO2 95%   BMI 30.11 kg/m   Wt Readings from Last 3 Encounters:  08/27/15 222 lb (100.7 kg)  08/08/15 220 lb (99.8 kg)  05/25/15 216 lb 9.6 oz (98.2 kg)    Physical Exam  Constitutional: He is oriented to person, place, and time. He appears well-developed and well-nourished. No distress.  HENT:  Head: Normocephalic and atraumatic.  Eyes: Conjunctivae and lids are normal. Right eye exhibits no discharge. Left eye exhibits no discharge. No scleral icterus.  Neck: Normal range of motion. Neck supple. No JVD present. Carotid bruit is not present.  Cardiovascular: Normal rate, regular rhythm and normal heart sounds.   Pulmonary/Chest: Effort normal and breath sounds normal. No respiratory distress.  Abdominal: Normal appearance. There is no splenomegaly or hepatomegaly.  Musculoskeletal: Normal range of motion.  Neurological: He is alert and oriented to person, place, and time.  Skin: Skin is  warm, dry and intact. No rash noted. No pallor.  Psychiatric: He has a normal mood and affect. His behavior is normal. Judgment and thought content normal.    Results for orders placed or performed in visit on 11/22/14  CBC  Result Value Ref Range   WBC 5.4 3.4 - 10.8 x10E3/uL   RBC 4.80 4.14 - 5.80 x10E6/uL   Hemoglobin 14.5 12.6 - 17.7 g/dL   Hematocrit 40.6 37.5 - 51.0 %   MCV 85 79 - 97 fL   MCH 30.2 26.6 - 33.0 pg   MCHC 35.7 31.5 - 35.7 g/dL   RDW 14.0 12.3 - 15.4 %   Platelets 180 150 - 379 x10E3/uL  Comprehensive metabolic panel  Result Value Ref Range   Glucose 93 65 - 99 mg/dL   BUN 17 6 - 24 mg/dL   Creatinine, Ser 1.14 0.76 - 1.27 mg/dL   GFR calc non Af Amer 77 >59 mL/min/1.73   GFR calc Af Amer 89 >59 mL/min/1.73   BUN/Creatinine Ratio 15 9 - 20   Sodium 139 136 - 144 mmol/L   Potassium 3.9 3.5 - 5.2 mmol/L   Chloride 100 97 - 106 mmol/L   CO2 23 18 - 29 mmol/L   Calcium 9.3 8.7 - 10.2 mg/dL   Total Protein 7.5 6.0 - 8.5 g/dL   Albumin 4.6 3.5 - 5.5 g/dL  Globulin, Total 2.9 1.5 - 4.5 g/dL   Albumin/Globulin Ratio 1.6 1.1 - 2.5   Bilirubin Total 0.5 0.0 - 1.2 mg/dL   Alkaline Phosphatase 77 39 - 117 IU/L   AST 38 0 - 40 IU/L   ALT 64 (H) 0 - 44 IU/L  TSH  Result Value Ref Range   TSH 2.790 0.450 - 4.500 uIU/mL  PSA  Result Value Ref Range   Prostate Specific Ag, Serum 0.5 0.0 - 4.0 ng/mL  HIV antibody  Result Value Ref Range   HIV Screen 4th Generation wRfx Non Reactive Non Reactive  Specimen status report  Result Value Ref Range   specimen status report Comment   Lipid Panel w/o Chol/HDL Ratio  Result Value Ref Range   Cholesterol, Total 269 (H) 100 - 199 mg/dL   Triglycerides 390 (H) 0 - 149 mg/dL   HDL 33 (L) >39 mg/dL   VLDL Cholesterol Cal 78 (H) 5 - 40 mg/dL   LDL Calculated 158 (H) 0 - 99 mg/dL      Assessment & Plan:   Problem List Items Addressed This Visit      Unprioritized   Hyperlipidemia - Primary    Unable to read Lipid  panel here.  Await results from [facility]      Relevant Orders   Lipid Panel Piccolo, Waived   Comprehensive metabolic panel   Obesity    Discussed exercise       Other Visit Diagnoses   None.      Follow up plan: Return in about 6 months (around 02/27/2016).

## 2015-08-28 LAB — COMPREHENSIVE METABOLIC PANEL
A/G RATIO: 1.8 (ref 1.2–2.2)
ALT: 76 IU/L — AB (ref 0–44)
AST: 34 IU/L (ref 0–40)
Albumin: 4.9 g/dL (ref 3.5–5.5)
Alkaline Phosphatase: 77 IU/L (ref 39–117)
BILIRUBIN TOTAL: 0.4 mg/dL (ref 0.0–1.2)
BUN/Creatinine Ratio: 13 (ref 9–20)
BUN: 16 mg/dL (ref 6–24)
CALCIUM: 9.7 mg/dL (ref 8.7–10.2)
CHLORIDE: 99 mmol/L (ref 96–106)
CO2: 21 mmol/L (ref 18–29)
Creatinine, Ser: 1.19 mg/dL (ref 0.76–1.27)
GFR calc Af Amer: 85 mL/min/{1.73_m2} (ref >59)
GFR, EST NON AFRICAN AMERICAN: 73 mL/min/{1.73_m2} (ref >59)
Globulin, Total: 2.7 g/dL (ref 1.5–4.5)
Glucose: 95 mg/dL (ref 65–99)
POTASSIUM: 4.7 mmol/L (ref 3.5–5.2)
Sodium: 141 mmol/L (ref 134–144)
Total Protein: 7.6 g/dL (ref 6.0–8.5)

## 2015-08-29 ENCOUNTER — Other Ambulatory Visit: Payer: Self-pay

## 2015-08-29 DIAGNOSIS — E785 Hyperlipidemia, unspecified: Secondary | ICD-10-CM

## 2015-08-29 LAB — LIPID PANEL W/O CHOL/HDL RATIO
Cholesterol, Total: 192 mg/dL (ref 100–199)
HDL: 31 mg/dL — AB (ref >39)
TRIGLYCERIDES: 452 mg/dL — AB (ref 0–149)

## 2015-08-29 LAB — SPECIMEN STATUS REPORT

## 2015-08-29 NOTE — Addendum Note (Signed)
Addended by: Kathrine Haddock on: 08/29/2015 03:21 PM   Modules accepted: Orders

## 2015-08-30 ENCOUNTER — Other Ambulatory Visit: Payer: BLUE CROSS/BLUE SHIELD

## 2015-08-30 DIAGNOSIS — E785 Hyperlipidemia, unspecified: Secondary | ICD-10-CM

## 2015-08-31 ENCOUNTER — Encounter: Payer: Self-pay | Admitting: Unknown Physician Specialty

## 2015-08-31 LAB — LIPID PANEL W/O CHOL/HDL RATIO
CHOLESTEROL TOTAL: 185 mg/dL (ref 100–199)
HDL: 36 mg/dL — ABNORMAL LOW (ref >39)
LDL CALC: 97 mg/dL (ref 0–99)
TRIGLYCERIDES: 258 mg/dL — AB (ref 0–149)
VLDL CHOLESTEROL CAL: 52 mg/dL — AB (ref 5–40)

## 2015-12-04 ENCOUNTER — Ambulatory Visit (INDEPENDENT_AMBULATORY_CARE_PROVIDER_SITE_OTHER): Payer: BLUE CROSS/BLUE SHIELD | Admitting: Unknown Physician Specialty

## 2015-12-04 ENCOUNTER — Encounter: Payer: Self-pay | Admitting: Unknown Physician Specialty

## 2015-12-04 VITALS — BP 143/91 | HR 58 | Temp 98.0°F | Ht 71.0 in | Wt 220.8 lb

## 2015-12-04 DIAGNOSIS — E785 Hyperlipidemia, unspecified: Secondary | ICD-10-CM | POA: Diagnosis not present

## 2015-12-04 DIAGNOSIS — Z0000 Encounter for general adult medical examination without abnormal findings: Secondary | ICD-10-CM

## 2015-12-04 DIAGNOSIS — Z23 Encounter for immunization: Secondary | ICD-10-CM

## 2015-12-04 DIAGNOSIS — I10 Essential (primary) hypertension: Secondary | ICD-10-CM | POA: Diagnosis not present

## 2015-12-04 MED ORDER — ATORVASTATIN CALCIUM 10 MG PO TABS: 10.0000 mg | ORAL_TABLET | Freq: Every day | ORAL | 1 refills | Status: DC

## 2015-12-04 NOTE — Patient Instructions (Addendum)
Influenza (Flu) Vaccine (Inactivated or Recombinant):  1. Why get vaccinated? Influenza ("flu") is a contagious disease that spreads around the [place] every year, usually between October and May. Flu is caused by influenza viruses, and is spread mainly by coughing, sneezing, and close contact. Anyone can get flu. Flu strikes suddenly and can last several days. Symptoms vary by age, but can include:  fever/chills  sore throat  muscle aches  fatigue  cough  headache  runny or stuffy nose Flu can also lead to pneumonia and blood infections, and cause diarrhea and seizures in children. If you have a medical condition, such as heart or lung disease, flu can make it worse. Flu is more dangerous for some people. Infants and young children, people 65 years of age and older, pregnant women, and people with certain health conditions or a weakened immune system are at greatest risk. Each year thousands of people in the [place] die from flu, and many more are hospitalized. Flu vaccine can:  keep you from getting flu,  make flu less severe if you do get it, and  keep you from spreading flu to your family and other people. 2. Inactivated and recombinant flu vaccines A dose of flu vaccine is recommended every flu season. Children 6 months through 8 years of age may need two doses during the same flu season. Everyone else needs only one dose each flu season. Some inactivated flu vaccines contain a very small amount of a mercury-based preservative called thimerosal. Studies have not shown thimerosal in vaccines to be harmful, but flu vaccines that do not contain thimerosal are available. There is no live flu virus in flu shots. They cannot cause the flu. There are many flu viruses, and they are always changing. Each year a new flu vaccine is made to protect against three or four viruses that are likely to cause disease in the upcoming flu season. But even when the vaccine doesn't exactly  match these viruses, it may still provide some protection. Flu vaccine cannot prevent:  flu that is caused by a virus not covered by the vaccine, or  illnesses that look like flu but are not. It takes about 2 weeks for protection to develop after vaccination, and protection lasts through the flu season. 3. Some people should not get this vaccine Tell the person who is giving you the vaccine:  If you have any severe, life-threatening allergies. If you ever had a life-threatening allergic reaction after a dose of flu vaccine, or have a severe allergy to any part of this vaccine, you may be advised not to get vaccinated. Most, but not all, types of flu vaccine contain a small amount of egg protein.  If you ever had Guillain-Barre Syndrome (also called GBS). Some people with a history of GBS should not get this vaccine. This should be discussed with your doctor.  If you are not feeling well. It is usually okay to get flu vaccine when you have a mild illness, but you might be asked to come back when you feel better. 4. Risks of a vaccine reaction With any medicine, including vaccines, there is a chance of reactions. These are usually mild and go away on their own, but serious reactions are also possible. Most people who get a flu shot do not have any problems with it. Minor problems following a flu shot include:  soreness, redness, or swelling where the shot was given  hoarseness  sore, red or itchy eyes  cough    fever  aches  headache  itching  fatigue If these problems occur, they usually begin soon after the shot and last 1 or 2 days. More serious problems following a flu shot can include the following:  There may be a small increased risk of Guillain-Barre Syndrome (GBS) after inactivated flu vaccine. This risk has been estimated at 1 or 2 additional cases per million people vaccinated. This is much lower than the risk of severe complications from flu, which can be prevented by  flu vaccine.  Young children who get the flu shot along with pneumococcal vaccine (PCV13) and/or DTaP vaccine at the same time might be slightly more likely to have a seizure caused by fever. Ask your doctor for more information. Tell your doctor if a child who is getting flu vaccine has ever had a seizure. Problems that could happen after any injected vaccine:  People sometimes faint after a medical procedure, including vaccination. Sitting or lying down for about 15 minutes can help prevent fainting, and injuries caused by a fall. Tell your doctor if you feel dizzy, or have vision changes or ringing in the ears.  Some people get severe pain in the shoulder and have difficulty moving the arm where a shot was given. This happens very rarely.  Any medication can cause a severe allergic reaction. Such reactions from a vaccine are very rare, estimated at about 1 in a million doses, and would happen within a few minutes to a few hours after the vaccination. As with any medicine, there is a very remote chance of a vaccine causing a serious injury or death. The safety of vaccines is always being monitored. For more information, visit: www.cdc.gov/vaccinesafety/ 5. What if there is a serious reaction? What should I look for?  Look for anything that concerns you, such as signs of a severe allergic reaction, very high fever, or unusual behavior. Signs of a severe allergic reaction can include hives, swelling of the face and throat, difficulty breathing, a fast heartbeat, dizziness, and weakness. These would start a few minutes to a few hours after the vaccination. What should I do?  If you think it is a severe allergic reaction or other emergency that can't wait, call 9-1-1 and get the person to the nearest hospital. Otherwise, call your doctor.  Reactions should be reported to the Vaccine Adverse Event Reporting System (VAERS). Your doctor should file this report, or you can do it yourself through the  VAERS web site at www.vaers.hhs.gov, or by calling 1-800-822-7967. VAERS does not give medical advice. 6. The National Vaccine Injury Compensation Program The National Vaccine Injury Compensation Program (VICP) is a federal program that was created to compensate people who may have been injured by certain vaccines. Persons who believe they may have been injured by a vaccine can learn about the program and about filing a claim by calling 1-800-338-2382 or visiting the VICP website at www.hrsa.gov/vaccinecompensation. There is a time limit to file a claim for compensation. 7. How can I learn more?  Ask your healthcare provider. He or she can give you the vaccine package insert or suggest other sources of information.  Call your local or state health department.  Contact the Centers for Disease Control and Prevention (CDC):  Call 1-800-232-4636 (1-800-CDC-INFO) or  Visit CDC's website at www.cdc.gov/flu Vaccine Information Statement Inactivated Influenza Vaccine (09/09/2013)   This information is not intended to replace advice given to you by your health care provider. Make sure you discuss any questions you have with   your health care provider.   DASH Eating Plan DASH stands for "Dietary Approaches to Stop Hypertension." The DASH eating plan is a healthy eating plan that has been shown to reduce high blood pressure (hypertension). Additional health benefits may include reducing the risk of type 2 diabetes mellitus, heart disease, and stroke. The DASH eating plan may also help with weight loss. WHAT DO I NEED TO KNOW ABOUT THE DASH EATING PLAN? For the DASH eating plan, you will follow these general guidelines:  Choose foods with a percent daily value for sodium of less than 5% (as listed on the food label).  Use salt-free seasonings or herbs instead of table salt or sea salt.  Check with your health care provider or pharmacist before using salt substitutes.  Eat lower-sodium products,  often labeled as "lower sodium" or "no salt added."  Eat fresh foods.  Eat more vegetables, fruits, and low-fat dairy products.  Choose whole grains. Look for the word "whole" as the first word in the ingredient list.  Choose fish and skinless chicken or [place] more often than red meat. Limit fish, poultry, and meat to 6 oz (170 g) each day.  Limit sweets, desserts, sugars, and sugary drinks.  Choose heart-healthy fats.  Limit cheese to 1 oz (28 g) per day.  Eat more home-cooked food and less restaurant, buffet, and fast food.  Limit fried foods.  Cook foods using methods other than frying.  Limit canned vegetables. If you do use them, rinse them well to decrease the sodium.  When eating at a restaurant, ask that your food be prepared with less salt, or no salt if possible. WHAT FOODS CAN I EAT? Seek help from a dietitian for individual calorie needs. Grains Whole grain or whole wheat bread. Brown rice. Whole grain or whole wheat pasta. Quinoa, bulgur, and whole grain cereals. Low-sodium cereals. Corn or whole wheat flour tortillas. Whole grain cornbread. Whole grain crackers. Low-sodium crackers. Vegetables Fresh or frozen vegetables (raw, steamed, roasted, or grilled). Low-sodium or reduced-sodium tomato and vegetable juices. Low-sodium or reduced-sodium tomato sauce and paste. Low-sodium or reduced-sodium canned vegetables.  Fruits All fresh, canned (in natural juice), or frozen fruits. Meat and Other Protein Products Ground beef (85% or leaner), grass-fed beef, or beef trimmed of fat. Skinless chicken or [place]. Ground chicken or [place]. Pork trimmed of fat. All fish and seafood. Eggs. Dried beans, peas, or lentils. Unsalted nuts and seeds. Unsalted canned beans. Dairy Low-fat dairy products, such as skim or 1% milk, 2% or reduced-fat cheeses, low-fat ricotta or cottage cheese, or plain low-fat yogurt. Low-sodium or reduced-sodium cheeses. Fats and Oils Tub margarines  without trans fats. Light or reduced-fat mayonnaise and salad dressings (reduced sodium). Avocado. Safflower, olive, or canola oils. Natural peanut or almond butter. Other Unsalted popcorn and pretzels. The items listed above may not be a complete list of recommended foods or beverages. Contact your dietitian for more options. WHAT FOODS ARE NOT RECOMMENDED? Grains White bread. White pasta. White rice. Refined cornbread. Bagels and croissants. Crackers that contain trans fat. Vegetables Creamed or fried vegetables. Vegetables in a cheese sauce. Regular canned vegetables. Regular canned tomato sauce and paste. Regular tomato and vegetable juices. Fruits Dried fruits. Canned fruit in light or heavy syrup. Fruit juice. Meat and Other Protein Products Fatty cuts of meat. Ribs, chicken wings, bacon, sausage, bologna, salami, chitterlings, fatback, hot dogs, bratwurst, and packaged luncheon meats. Salted nuts and seeds. Canned beans with salt. Dairy Whole or 2% milk, cream, half-and-half, and  cream cheese. Whole-fat or sweetened yogurt. Full-fat cheeses or blue cheese. Nondairy creamers and whipped toppings. Processed cheese, cheese spreads, or cheese curds. Condiments Onion and garlic salt, seasoned salt, table salt, and sea salt. Canned and packaged gravies. Worcestershire sauce. Tartar sauce. Barbecue sauce. Teriyaki sauce. Soy sauce, including reduced sodium. Steak sauce. Fish sauce. Oyster sauce. Cocktail sauce. Horseradish. Ketchup and mustard. Meat flavorings and tenderizers. Bouillon cubes. Hot sauce. Tabasco sauce. Marinades. Taco seasonings. Relishes. Fats and Oils Butter, stick margarine, lard, shortening, ghee, and bacon fat. Coconut, palm kernel, or palm oils. Regular salad dressings. Other Pickles and olives. Salted popcorn and pretzels. The items listed above may not be a complete list of foods and beverages to avoid. Contact your dietitian for more information. WHERE CAN I FIND MORE  INFORMATION? National Heart, Lung, and Blood Institute: travelstabloid.com   This information is not intended to replace advice given to you by your health care provider. Make sure you discuss any questions you have with your health care provider.   Document Released: 01/09/2011 Document Revised: 02/10/2014 Document Reviewed: 11/24/2012 Elsevier Interactive Patient Education [employer].

## 2015-12-04 NOTE — Progress Notes (Signed)
BP (!) 143/91 (BP Location: Left Arm, Cuff Size: Large)   Pulse (!) 58   Temp 98 F (36.7 C)   Ht 5\' 11"  (1.803 m)   Wt 220 lb 12.8 oz (100.2 kg)   SpO2 97%   BMI 30.80 kg/m    Subjective:    Patient ID: George Chang, male    DOB: 07-08-1970, 45 y.o.   MRN: [id number]  HPI: George Chang is a 45 y.o. male  Chief Complaint  Patient presents with  . Annual Exam   Hypertension Using medications without difficulty Average home BPs Not checking but feels it's high today related to irritation.     No problems or lightheadedness No chest pain with exertion or shortness of breath No Edema  Hyperlipidemia Using medications without problems: No Muscle aches  Diet compliance:Tries to eat healthy Exercise: Off and on  Social History   Social History  . Marital status: Single    Spouse name: N/A  . Number of children: N/A  . Years of education: N/A   Occupational History  . Not on file.   Social History Main Topics  . Smoking status: Former Smoker    Quit date: 10/07/1996  . Smokeless tobacco: Never Used  . Alcohol use 7.2 oz/week    12 Cans of beer per week  . Drug use: No  . Sexual activity: Yes   Other Topics Concern  . Not on file   Social History Narrative  . No narrative on file   Family History  Problem Relation Age of Onset  . Cancer Father     colon  . Colon cancer Father 50    expired at 53 as well  . Hyperlipidemia Mother    Past Medical History:  Diagnosis Date  . Anal fissure 02/14/2015  . Back pain   . Environmental allergies   . GERD (gastroesophageal reflux disease)   . Personal history of colonic polyps 02/14/2015  . Plantar fasciitis    Past Surgical History:  Procedure Laterality Date  . COLONOSCOPY       Relevant past medical, surgical, family and social history reviewed and updated as indicated. Interim medical history since our last visit reviewed. Allergies and medications reviewed and updated.  Review of Systems    Constitutional: Negative.   HENT: Negative.   Eyes: Negative.   Respiratory: Negative.   Cardiovascular: Negative.   Gastrointestinal: Negative.   Endocrine: Negative.   Genitourinary: Negative.   Skin: Negative.   Allergic/Immunologic: Negative.   Neurological: Negative.   Hematological: Negative.   Psychiatric/Behavioral: Negative.     Per HPI unless specifically indicated above     Objective:    BP (!) 143/91 (BP Location: Left Arm, Cuff Size: Large)   Pulse (!) 58   Temp 98 F (36.7 C)   Ht 5\' 11"  (1.803 m)   Wt 220 lb 12.8 oz (100.2 kg)   SpO2 97%   BMI 30.80 kg/m   Wt Readings from Last 3 Encounters:  12/04/15 220 lb 12.8 oz (100.2 kg)  08/27/15 222 lb (100.7 kg)  08/08/15 220 lb (99.8 kg)    Physical Exam  Constitutional: He is oriented to person, place, and time. He appears well-developed and well-nourished.  HENT:  Head: Normocephalic.  Right Ear: Tympanic membrane, external ear and ear canal normal.  Left Ear: Tympanic membrane, external ear and ear canal normal.  Mouth/Throat: Uvula is midline, oropharynx is clear and moist and mucous membranes are normal.  Eyes:  Pupils are equal, round, and reactive to light.  Cardiovascular: Normal rate, regular rhythm and normal heart sounds.  Exam reveals no gallop and no friction rub.   No murmur heard. Pulmonary/Chest: Effort normal and breath sounds normal. No respiratory distress.  Abdominal: Soft. Bowel sounds are normal. He exhibits no distension. There is no tenderness.  Musculoskeletal: Normal range of motion.  Neurological: He is alert and oriented to person, place, and time. He has normal reflexes.  Skin: Skin is warm and dry.  Psychiatric: He has a normal mood and affect. His behavior is normal. Judgment and thought content normal.    Results for orders placed or performed in visit on 08/30/15  Lipid Panel w/o Chol/HDL Ratio  Result Value Ref Range   Cholesterol, Total 185 100 - 199 mg/dL    Triglycerides 258 (H) 0 - 149 mg/dL   HDL 36 (L) >39 mg/dL   VLDL Cholesterol Cal 52 (H) 5 - 40 mg/dL   LDL Calculated 97 0 - 99 mg/dL      Assessment & Plan:   Problem List Items Addressed This Visit      Unprioritized   Hyperlipidemia    Stable, continue present medications.        Relevant Medications   atorvastatin (LIPITOR) 10 MG tablet   Hypertension    DASH diet.  Recheck 6 months.      Relevant Medications   atorvastatin (LIPITOR) 10 MG tablet    Other Visit Diagnoses    Need for influenza vaccination    -  Primary   Relevant Orders   Flu Vaccine QUAD 36+ mos IM (Completed)   Annual physical exam       Relevant Orders   Comprehensive metabolic panel   CBC with Differential/Platelet   Lipid Panel w/o Chol/HDL Ratio   TSH       Follow up plan: Return in about 6 months (around 06/02/2016).

## 2015-12-04 NOTE — Assessment & Plan Note (Signed)
DASH diet.  Recheck 6 months.

## 2015-12-04 NOTE — Assessment & Plan Note (Signed)
Stable, continue present medications.   

## 2015-12-05 ENCOUNTER — Encounter: Payer: Self-pay | Admitting: Unknown Physician Specialty

## 2015-12-05 LAB — CBC WITH DIFFERENTIAL/PLATELET
BASOS ABS: 0 10*3/uL (ref 0.0–0.2)
Basos: 1 %
EOS (ABSOLUTE): 0.1 10*3/uL (ref 0.0–0.4)
Eos: 2 %
Hematocrit: 43.2 % (ref 37.5–51.0)
Hemoglobin: 14.7 g/dL (ref 12.6–17.7)
Immature Grans (Abs): 0 10*3/uL (ref 0.0–0.1)
Immature Granulocytes: 1 %
LYMPHS ABS: 1.3 10*3/uL (ref 0.7–3.1)
Lymphs: 32 %
MCH: 30 pg (ref 26.6–33.0)
MCHC: 34 g/dL (ref 31.5–35.7)
MCV: 88 fL (ref 79–97)
MONOS ABS: 0.4 10*3/uL (ref 0.1–0.9)
Monocytes: 9 %
NEUTROS ABS: 2.3 10*3/uL (ref 1.4–7.0)
Neutrophils: 55 %
PLATELETS: 155 10*3/uL (ref 150–379)
RBC: 4.9 x10E6/uL (ref 4.14–5.80)
RDW: 12.9 % (ref 12.3–15.4)
WBC: 4.1 10*3/uL (ref 3.4–10.8)

## 2015-12-05 LAB — COMPREHENSIVE METABOLIC PANEL
A/G RATIO: 1.7 (ref 1.2–2.2)
ALBUMIN: 4.5 g/dL (ref 3.5–5.5)
ALT: 64 IU/L — AB (ref 0–44)
AST: 29 IU/L (ref 0–40)
Alkaline Phosphatase: 78 IU/L (ref 39–117)
BILIRUBIN TOTAL: 0.5 mg/dL (ref 0.0–1.2)
BUN / CREAT RATIO: 15 (ref 9–20)
BUN: 18 mg/dL (ref 6–24)
CHLORIDE: 101 mmol/L (ref 96–106)
CO2: 25 mmol/L (ref 18–29)
Calcium: 9.4 mg/dL (ref 8.7–10.2)
Creatinine, Ser: 1.18 mg/dL (ref 0.76–1.27)
GFR calc non Af Amer: 74 mL/min/{1.73_m2} (ref >59)
GFR, EST AFRICAN AMERICAN: 85 mL/min/{1.73_m2} (ref >59)
GLOBULIN, TOTAL: 2.6 g/dL (ref 1.5–4.5)
Glucose: 95 mg/dL (ref 65–99)
POTASSIUM: 4.3 mmol/L (ref 3.5–5.2)
SODIUM: 141 mmol/L (ref 134–144)
TOTAL PROTEIN: 7.1 g/dL (ref 6.0–8.5)

## 2015-12-05 LAB — LIPID PANEL W/O CHOL/HDL RATIO
CHOLESTEROL TOTAL: 197 mg/dL (ref 100–199)
HDL: 39 mg/dL — ABNORMAL LOW (ref >39)
LDL Calculated: 103 mg/dL — ABNORMAL HIGH (ref 0–99)
Triglycerides: 277 mg/dL — ABNORMAL HIGH (ref 0–149)
VLDL Cholesterol Cal: 55 mg/dL — ABNORMAL HIGH (ref 5–40)

## 2015-12-05 LAB — TSH: TSH: 3.14 u[IU]/mL (ref 0.450–4.500)

## 2016-06-03 ENCOUNTER — Ambulatory Visit: Payer: BLUE CROSS/BLUE SHIELD | Admitting: Unknown Physician Specialty

## 2017-08-22 IMAGING — CR DG ELBOW COMPLETE 3+V*R*
7 series · 7 of 7 positions shown · non-contrast
Comparison: None.

CLINICAL DATA: Right posterior elbow pain. Patient is unable to
extend elbow/arm completely.

EXAM:
RIGHT ELBOW - COMPLETE 3+ VIEW

[elbow ap (1 of 3)]
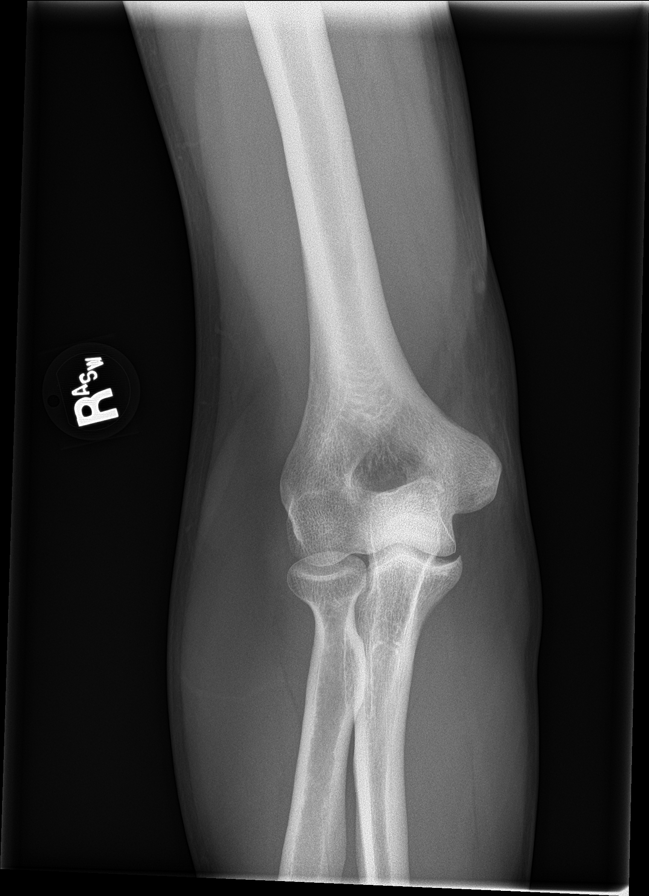

[elbow lat (1 of 2)]
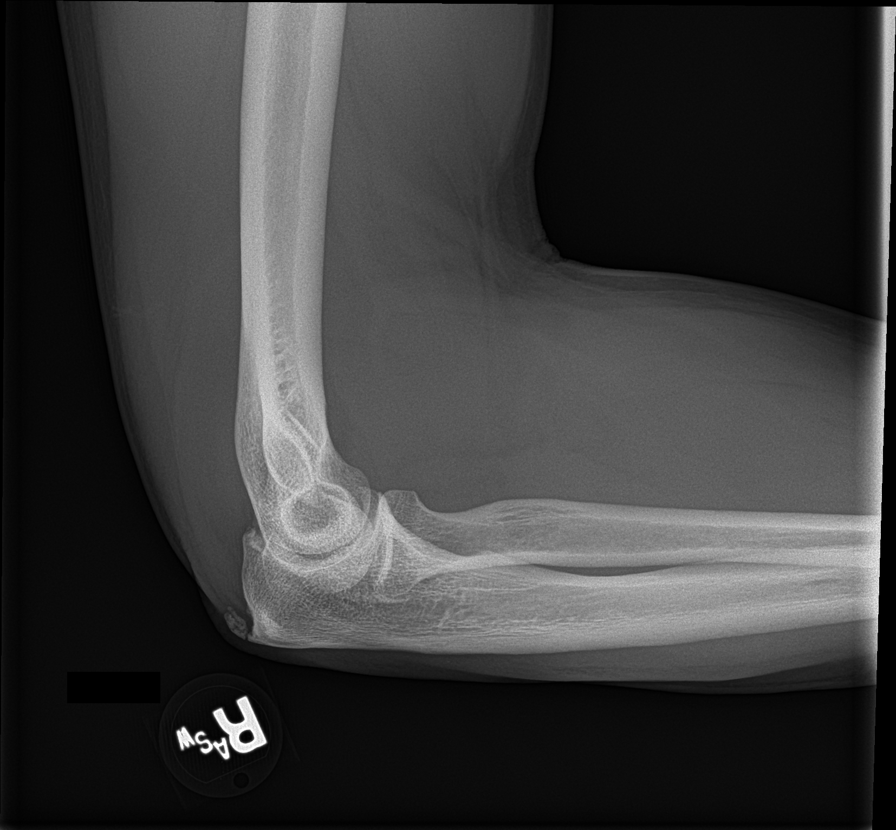

[elbow obl (1 of 2)]
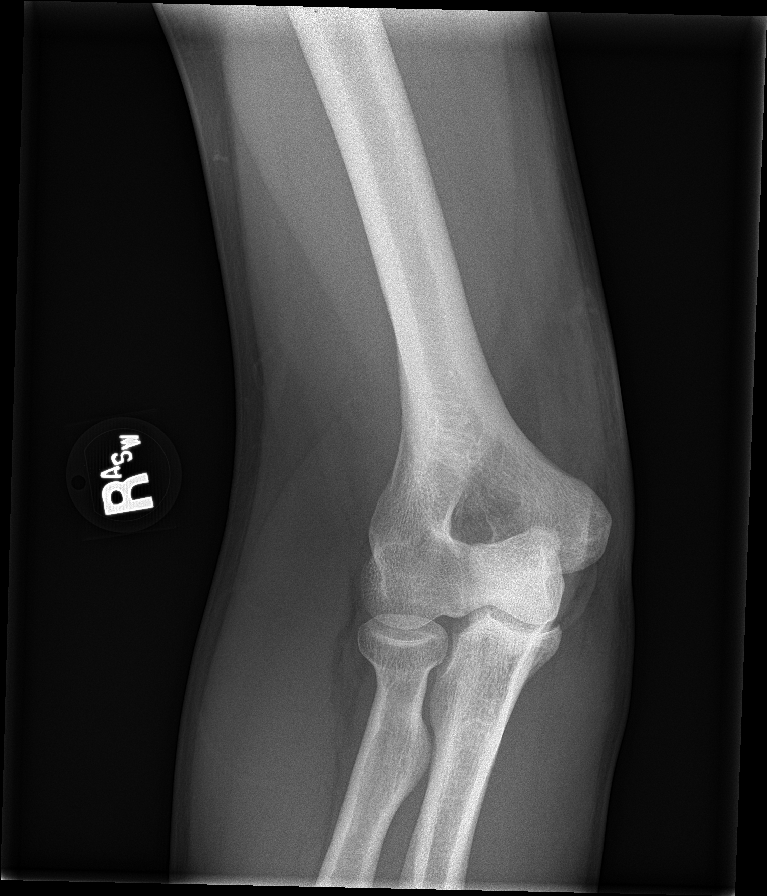

[elbow obl (2 of 2)]
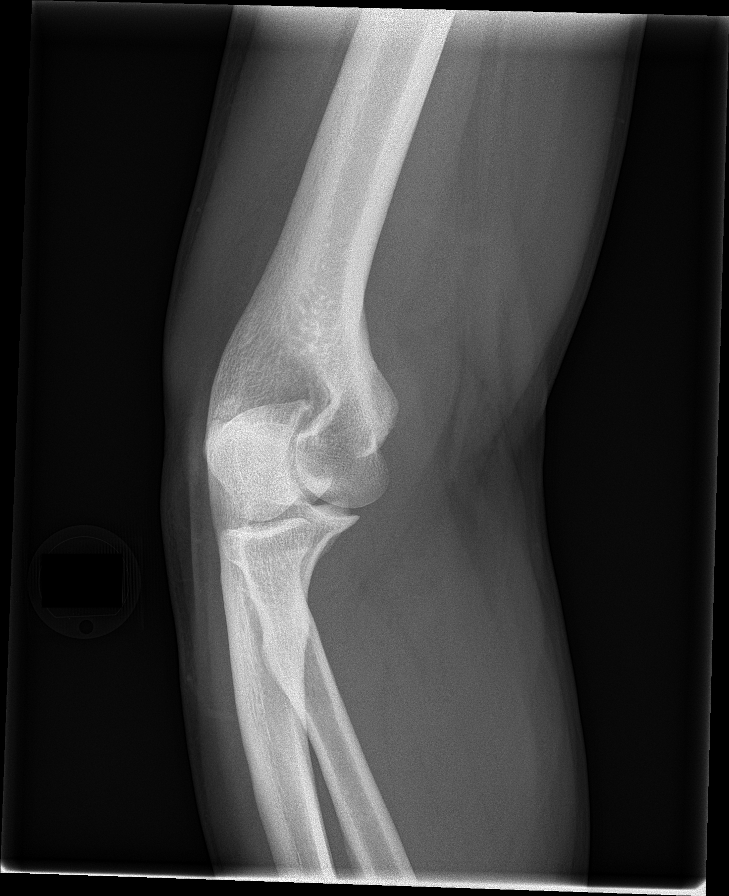

[elbow lat (2 of 2)]
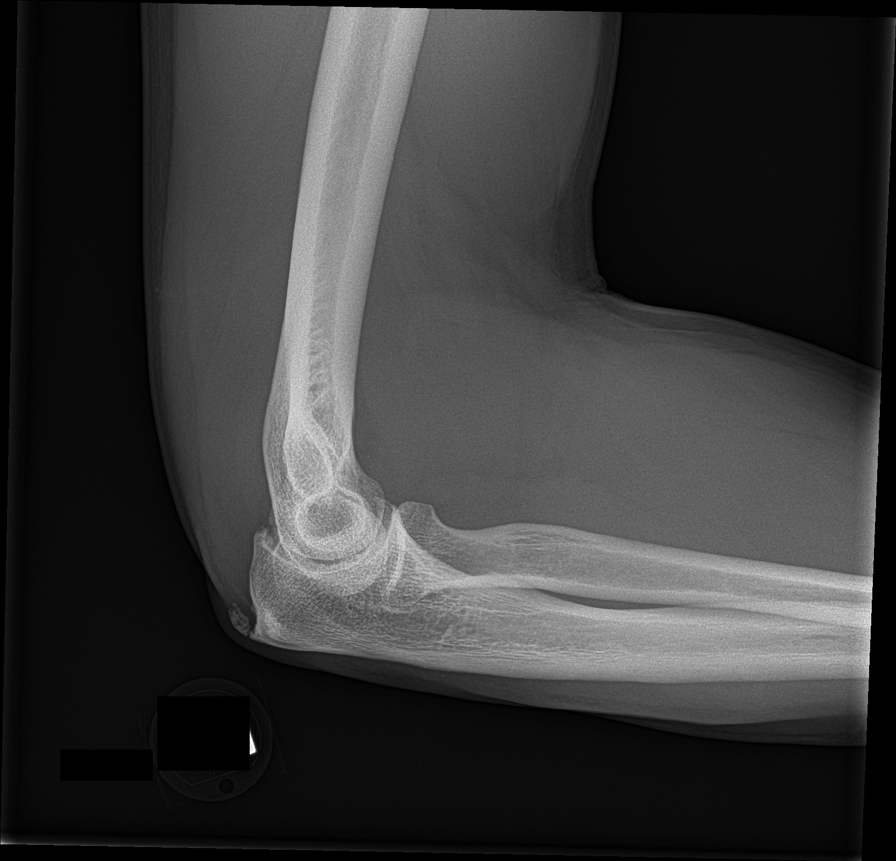

[elbow ap (2 of 3)]
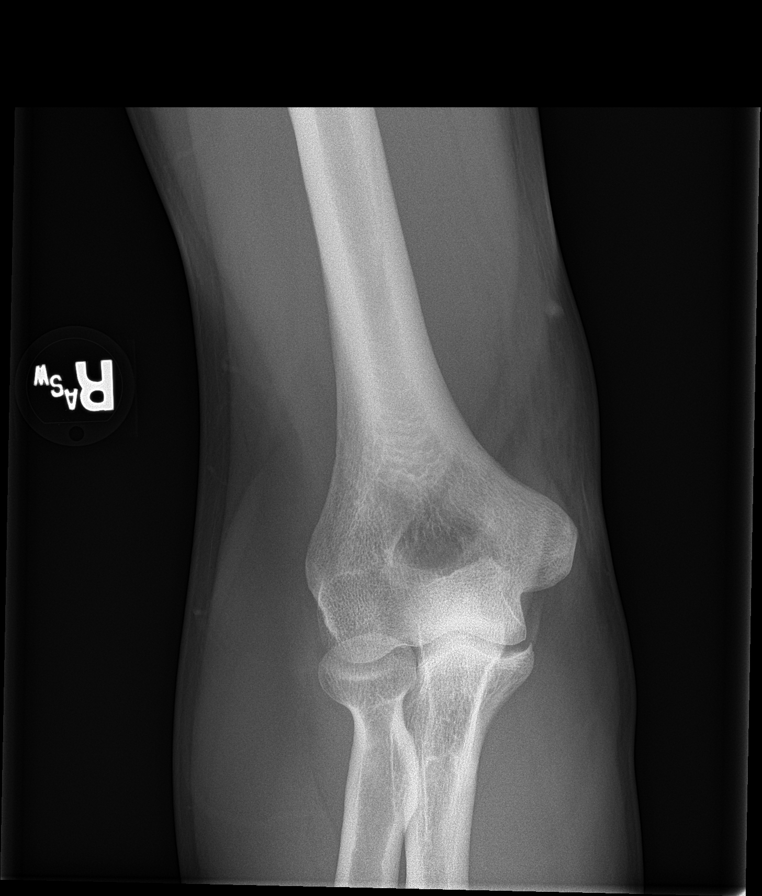

[elbow ap (3 of 3)]
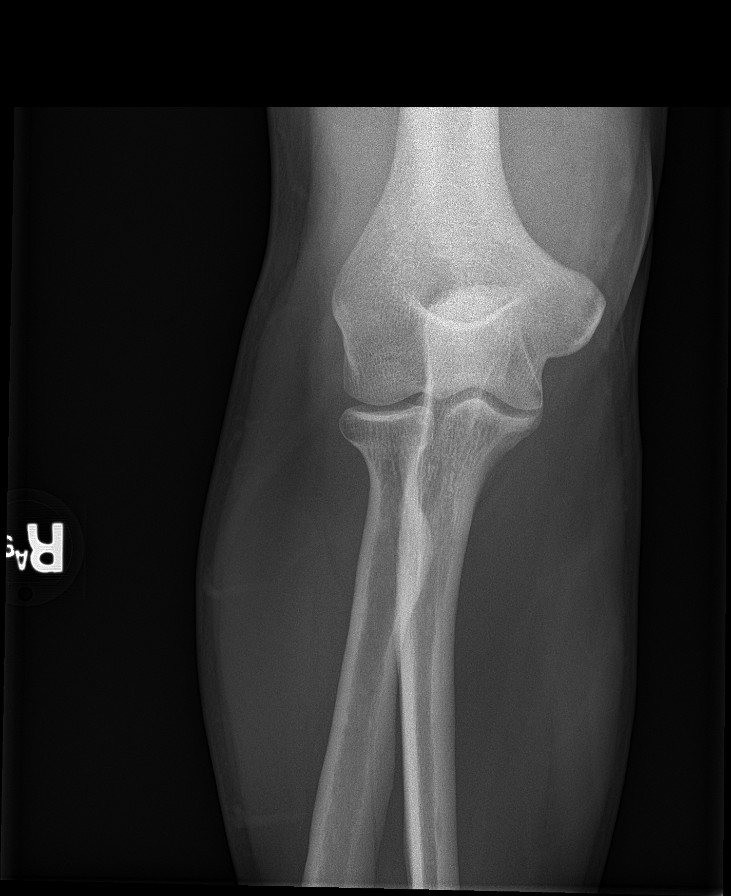

[7 of 7 positions shown; findings below may reference images not displayed]

FINDINGS: There is no evidence of fracture, dislocation, or joint effusion.
There is no evidence of arthropathy or other focal bone abnormality.
Soft tissues are unremarkable.
IMPRESSION: Negative.

## 2018-04-06 ENCOUNTER — Ambulatory Visit
Admission: EM | Admit: 2018-04-06 | Discharge: 2018-04-06 | Disposition: A | Discharge status: Home / Self Care | Payer: BLUE CROSS/BLUE SHIELD | Attending: Family Medicine | Admitting: Family Medicine

## 2018-04-06 ENCOUNTER — Encounter: Payer: Self-pay | Admitting: Emergency Medicine

## 2018-04-06 ENCOUNTER — Other Ambulatory Visit: Payer: Self-pay

## 2018-04-06 DIAGNOSIS — J189 Pneumonia, unspecified organism: Secondary | ICD-10-CM | POA: Diagnosis not present

## 2018-04-06 MED ORDER — AZITHROMYCIN 250 MG PO TABS: ORAL_TABLET | ORAL | 0 refills | Status: DC

## 2018-04-06 MED ORDER — HYDROCOD POLST-CPM POLST ER 10-8 MG/5ML PO SUER: 5.0000 mL | Freq: Two times a day (BID) | ORAL | 0 refills | Status: DC | PRN

## 2018-04-06 NOTE — ED Triage Notes (Signed)
Patient c/o cough and chest congestion for the past 2 days.  Patient also reports HAs and chills.  Patient denies fevers.

## 2018-04-06 NOTE — ED Provider Notes (Signed)
[facility]    CSN: [id number] Arrival date & time: 04/06/18  0802     History   Chief Complaint Chief Complaint  Patient presents with  . Cough    HPI George Chang is a 48 y.o. male.   The history is provided by the patient.  Cough  Associated symptoms: no myalgias and no wheezing   URI  Presenting symptoms: cough and fatigue   Severity:  Moderate Onset quality:  Sudden Duration:  2 days Timing:  Constant Progression:  Worsening Chronicity:  New Relieved by:  None tried Ineffective treatments:  None tried Associated symptoms: no myalgias and no wheezing   Associated symptoms comment:  Chills Risk factors: sick contacts     Past Medical History:  Diagnosis Date  . Anal fissure 02/14/2015  . Back pain   . Environmental allergies   . GERD (gastroesophageal reflux disease)   . Personal history of colonic polyps 02/14/2015  . Plantar fasciitis     Patient Active Problem List   Diagnosis Date Noted  . Hypertension 12/04/2015  . Obesity 08/27/2015  . Personal history of colonic polyps 02/14/2015  . Anal fissure 02/14/2015  . Family history of colon cancer 11/20/2014  . Hyperlipidemia 11/20/2014    Past Surgical History:  Procedure Laterality Date  . COLONOSCOPY         Home Medications    Prior to Admission medications   Medication Sig Start Date End Date Taking? Authorizing Provider  atorvastatin (LIPITOR) 10 MG tablet Take 1 tablet (10 mg total) by mouth daily. 12/04/15   Kathrine Haddock, NP  azithromycin (ZITHROMAX Z-PAK) 250 MG tablet 2 tabs po once day 1, then 1 tab po qd for next 4 days 04/06/18   Norval Gable, MD  calcium carbonate (TUMS - DOSED IN MG ELEMENTAL CALCIUM) 500 MG chewable tablet Chew 1 tablet by mouth daily. Reported on 02/07/2015    [provider]  chlorpheniramine-HYDROcodone (TUSSIONEX PENNKINETIC ER) 10-8 MG/5ML SUER Take 5 mLs by mouth every 12 (twelve) hours as needed. 04/06/18   Norval Gable, MD    cyclobenzaprine (FLEXERIL) 10 MG tablet TAKE 1 TABLET (10 MG TOTAL) BY MOUTH AT BEDTIME. Patient taking differently: TAKE 1 TABLET (10 MG TOTAL) BY MOUTH AT BEDTIME. PRN 12/20/14   Kathrine Haddock, NP  ibuprofen (ADVIL,MOTRIN) 200 MG tablet Take 200 mg by mouth daily as needed.    [provider]  orphenadrine (NORFLEX) 100 MG tablet Take 1 tablet (100 mg total) by mouth 2 (two) times daily. Patient taking differently: Take 100 mg by mouth 2 (two) times daily as needed.  08/08/15   Frederich Cha, MD    Family History Family History  Problem Relation Age of Onset  . Cancer Father        colon  . Colon cancer Father 50       expired at 36 as well  . Hyperlipidemia Mother     Social History Social History   Tobacco Use  . Smoking status: Former Smoker    Last attempt to quit: 10/07/1996    Years since quitting: 21.5  . Smokeless tobacco: Never Used  Substance Use Topics  . Alcohol use: Yes    Alcohol/week: 12.0 standard drinks    Types: 12 Cans of beer per week  . Drug use: No     Allergies   Pravastatin   Review of Systems Review of Systems  Constitutional: Positive for fatigue.  Respiratory: Positive for cough. Negative for wheezing.  Musculoskeletal: Negative for myalgias.     Physical Exam Triage Vital Signs ED Triage Vitals  Enc Vitals Group     BP 04/06/18 0817 113/74     Pulse Rate 04/06/18 0817 78     Resp 04/06/18 0817 16     Temp 04/06/18 0817 99.7 F (37.6 C)     Temp Source 04/06/18 0817 Oral     SpO2 04/06/18 0817 97 %     Weight 04/06/18 0814 214 lb (97.1 kg)     Height 04/06/18 0814 6' (1.829 m)     Head Circumference --      Peak Flow --      Pain Score 04/06/18 0814 9     Pain Loc --      Pain Edu? --      Excl. in [place]? --    No data found.  Updated Vital Signs BP 113/74 (BP Location: Left Arm)   Pulse 78   Temp 99.7 F (37.6 C) (Oral)   Resp 16   Ht 6' (1.829 m)   Wt 97.1 kg   SpO2 97%   BMI 29.02 kg/m   Visual  Acuity Right Eye Distance:   Left Eye Distance:   Bilateral Distance:    Right Eye Near:   Left Eye Near:    Bilateral Near:     Physical Exam Vitals signs and nursing note reviewed.  Constitutional:      General: He is not in acute distress.    Appearance: He is well-developed. He is not toxic-appearing or diaphoretic.  HENT:     Head: Normocephalic and atraumatic.     Nose: Nose normal.     Mouth/Throat:     Pharynx: Uvula midline. No oropharyngeal exudate.     Tonsils: No tonsillar abscesses.  Neck:     Musculoskeletal: Normal range of motion and neck supple.     Thyroid: No thyromegaly.     Trachea: No tracheal deviation.  Cardiovascular:     Rate and Rhythm: Normal rate and regular rhythm.     Heart sounds: Normal heart sounds.  Pulmonary:     Effort: Pulmonary effort is normal. No respiratory distress.     Breath sounds: No stridor. Rales (left base) present. No wheezing or rhonchi.  Chest:     Chest wall: No tenderness.  Lymphadenopathy:     Cervical: No cervical adenopathy.  Skin:    General: Skin is warm and dry.     Findings: No rash.  Neurological:     Mental Status: He is alert.      UC Treatments / Results  Labs (all labs ordered are listed, but only abnormal results are displayed) Labs Reviewed - No data to display  EKG None  Radiology No results found.  Procedures Procedures (including critical care time)  Medications Ordered in UC Medications - No data to display  Initial Impression / Assessment and Plan / UC Course  I have reviewed the triage vital signs and the nursing notes.  Pertinent labs & imaging results that were available during my care of the patient were reviewed by me and considered in my medical decision making (see chart for details).      Final Clinical Impressions(s) / UC Diagnoses   Final diagnoses:  Walking pneumonia    ED Prescriptions    Medication Sig Dispense Auth. Provider   azithromycin (ZITHROMAX  Z-PAK) 250 MG tablet 2 tabs po once day 1, then 1 tab po qd for next 4  days 6 each Norval Gable, MD   chlorpheniramine-HYDROcodone [facility] ER) 10-8 MG/5ML SUER Take 5 mLs by mouth every 12 (twelve) hours as needed. 60 mL Norval Gable, MD     1. diagnosis reviewed with patient 2. rx as per orders above; reviewed possible side effects, interactions, risks and benefits  3. Recommend supportive treatment with rest, otc analgesics prn, fluids 4. Go to ED if symptoms worsen 5. Follow-up prn if not improving    Controlled Substance Prescriptions [place] Controlled Substance Registry consulted? Not Applicable   Norval Gable, MD 04/06/18 [phone]

## 2020-09-05 ENCOUNTER — Encounter: Payer: Self-pay | Admitting: Internal Medicine

## 2021-08-28 ENCOUNTER — Encounter: Payer: Self-pay | Admitting: Family Medicine

## 2021-08-28 ENCOUNTER — Ambulatory Visit: Payer: 59 | Admitting: Family Medicine

## 2021-08-28 VITALS — BP 125/80 | HR 57 | Temp 97.6°F | Ht 72.0 in | Wt 221.8 lb

## 2021-08-28 DIAGNOSIS — Z800 Family history of malignant neoplasm of digestive organs: Secondary | ICD-10-CM

## 2021-08-28 DIAGNOSIS — Z125 Encounter for screening for malignant neoplasm of prostate: Secondary | ICD-10-CM | POA: Diagnosis not present

## 2021-08-28 DIAGNOSIS — E785 Hyperlipidemia, unspecified: Secondary | ICD-10-CM

## 2021-08-28 DIAGNOSIS — G8929 Other chronic pain: Secondary | ICD-10-CM

## 2021-08-28 DIAGNOSIS — M545 Low back pain, unspecified: Secondary | ICD-10-CM | POA: Diagnosis not present

## 2021-08-28 DIAGNOSIS — I10 Essential (primary) hypertension: Secondary | ICD-10-CM

## 2021-08-28 DIAGNOSIS — Z1159 Encounter for screening for other viral diseases: Secondary | ICD-10-CM

## 2021-08-28 DIAGNOSIS — Z1211 Encounter for screening for malignant neoplasm of colon: Secondary | ICD-10-CM | POA: Diagnosis not present

## 2021-08-28 LAB — URINALYSIS, ROUTINE W REFLEX MICROSCOPIC
Bilirubin, UA: NEGATIVE
Glucose, UA: NEGATIVE
Ketones, UA: NEGATIVE
Leukocytes,UA: NEGATIVE
Nitrite, UA: NEGATIVE
Protein,UA: NEGATIVE
RBC, UA: NEGATIVE
Specific Gravity, UA: >1.03 — ABNORMAL HIGH (ref 1.005–1.030)
Urobilinogen, Ur: 0.2 mg/dL (ref 0.2–1.0)
pH, UA: 5.5 (ref 5.0–7.5)

## 2021-08-28 LAB — MICROALBUMIN, URINE WAIVED
Creatinine, Urine Waived: 100 mg/dL (ref 10–300)
Microalb, Ur Waived: 10 mg/L (ref 0–19)
Microalb/Creat Ratio: <30 mg/g (ref <30)

## 2021-08-28 MED ORDER — LISINOPRIL 20 MG PO TABS
20.0000 mg | ORAL_TABLET | Freq: Every day | ORAL | 1 refills | Status: DC
Start: 2021-08-28 — End: 2022-03-04

## 2021-08-28 MED ORDER — CYCLOBENZAPRINE HCL 10 MG PO TABS: 10.0000 mg | ORAL_TABLET | Freq: Every evening | ORAL | 3 refills | Status: AC | PRN

## 2021-08-28 MED ORDER — ATORVASTATIN CALCIUM 10 MG PO TABS
10.0000 mg | ORAL_TABLET | Freq: Every day | ORAL | 1 refills | Status: DC
Start: 2021-08-28 — End: 2022-03-04

## 2021-08-28 NOTE — Assessment & Plan Note (Signed)
Under good control on current regimen. Continue current regimen. Continue to monitor. Call with any concerns. Refills given. Labs drawn today.   

## 2021-08-28 NOTE — Progress Notes (Signed)
BP 125/80   Pulse (!) 57   Temp 97.6 F (36.4 C)   Ht 6' (1.829 m)   Wt 221 lb 12.8 oz (100.6 kg)   SpO2 98%   BMI 30.08 kg/m    Subjective:    Patient ID: George Chang, male    DOB: 05-09-1970, 51 y.o.   MRN: [id number]  HPI: George Chang is a 51 y.o. male  Chief Complaint  Patient presents with   [place]    Patient states he is here to establish care, use to be a patient here but went to [facility] and came back. Per patient his father passed away due to colon cancer and is due for a colonoscopy, patient states his old PCP was in the process of getting him scheduled for one but he then transferred here.    Hypertension   Hyperlipidemia   HYPERTENSION / HYPERLIPIDEMIA Satisfied with current treatment? yes Duration of hypertension: chronic BP monitoring frequency: not checking BP medication side effects: no Past BP meds: lisinopril Duration of hyperlipidemia: chronic Cholesterol medication side effects: unsure- stiff back Cholesterol supplements: none Past cholesterol medications: atorvastatin Medication compliance: excellent compliance Aspirin: no Recent stressors: no Recurrent headaches: no Visual changes: no Palpitations: no Dyspnea: no Chest pain: no Lower extremity edema: no Dizzy/lightheaded: no  BACK PAIN Duration: chronic, but comes and goes- has been acting up the past few days Mechanism of injury: bending Location: bilateral and low back Onset: sudden Severity: severe Quality: sharp catching Frequency: intermittent Radiation: none Aggravating factors: bending over Alleviating factors: nothing Status: fluctuating Treatments attempted: rest, ice, heat, APAP, ibuprofen, and aleve  Relief with NSAIDs?: mild Nighttime pain:  no Paresthesias / decreased sensation:  no Bowel / bladder incontinence:  no Fevers:  no Dysuria / urinary frequency:  no   Active Ambulatory Problems    Diagnosis Date Noted   Family history of colon cancer  11/20/2014   Hyperlipidemia 11/20/2014   Obesity 08/27/2015   Hypertension 12/04/2015   Resolved Ambulatory Problems    Diagnosis Date Noted   Personal history of colonic polyps 02/14/2015   Anal fissure 02/14/2015   Past Medical History:  Diagnosis Date   Back pain    Environmental allergies    GERD (gastroesophageal reflux disease)    Plantar fasciitis    Past Surgical History:  Procedure Laterality Date   COLONOSCOPY     Outpatient Encounter Medications as of 08/28/2021  Medication Sig Note   calcium carbonate (TUMS - DOSED IN MG ELEMENTAL CALCIUM) 500 MG chewable tablet Chew 1 tablet by mouth daily. Reported on 02/07/2015    cyclobenzaprine (FLEXERIL) 10 MG tablet Take 1 tablet (10 mg total) by mouth at bedtime as needed for muscle spasms.    [DISCONTINUED] atorvastatin (LIPITOR) 10 MG tablet Take 1 tablet (10 mg total) by mouth daily.    [DISCONTINUED] lisinopril (ZESTRIL) 20 MG tablet Take 20 mg by mouth daily.    atorvastatin (LIPITOR) 10 MG tablet Take 1 tablet (10 mg total) by mouth daily.    lisinopril (ZESTRIL) 20 MG tablet Take 1 tablet (20 mg total) by mouth daily.    [DISCONTINUED] azithromycin (ZITHROMAX Z-PAK) 250 MG tablet 2 tabs po once day 1, then 1 tab po qd for next 4 days (Patient not taking: Reported on 08/28/2021)    [DISCONTINUED] chlorpheniramine-HYDROcodone (TUSSIONEX PENNKINETIC ER) 10-8 MG/5ML SUER Take 5 mLs by mouth every 12 (twelve) hours as needed. (Patient not taking: Reported on 08/28/2021)    [DISCONTINUED] cyclobenzaprine (  FLEXERIL) 10 MG tablet TAKE 1 TABLET (10 MG TOTAL) BY MOUTH AT BEDTIME. (Patient taking differently: TAKE 1 TABLET (10 MG TOTAL) BY MOUTH AT BEDTIME. PRN)    [DISCONTINUED] ibuprofen (ADVIL,MOTRIN) 200 MG tablet Take 200 mg by mouth daily as needed. (Patient not taking: Reported on 08/28/2021) 02/07/2015: Last dose 2 days.   [DISCONTINUED] orphenadrine (NORFLEX) 100 MG tablet Take 1 tablet (100 mg total) by mouth 2 (two) times daily.  (Patient not taking: Reported on 08/28/2021)    No facility-administered encounter medications on file as of 08/28/2021.   Allergies  Allergen Reactions   Pravastatin Other (See Comments)    Hard stools   Family History  Problem Relation Age of Onset   Cancer Father        colon   Colon cancer Father 84       expired at 21 as well   Hyperlipidemia Mother    Social History   Socioeconomic History   Marital status: Single    Spouse name: Not on file   Number of children: Not on file   Years of education: Not on file   Highest education level: Not on file  Occupational History   Not on file  Tobacco Use   Smoking status: Former    Types: Cigarettes    Quit date: 10/07/1996    Years since quitting: 24.9   Smokeless tobacco: Never  Vaping Use   Vaping Use: Never used  Substance and Sexual Activity   Alcohol use: Yes    Alcohol/week: 12.0 standard drinks of alcohol    Types: 12 Cans of beer per week   Drug use: No   Sexual activity: Yes  Other Topics Concern   Not on file  Social History Narrative   Not on file   Social Determinants of Health   Financial Resource Strain: Not on file  Food Insecurity: Not on file  Transportation Needs: Not on file  Physical Activity: Not on file  Stress: Not on file  Social Connections: Not on file    Review of Systems  Constitutional: Negative.   Respiratory: Negative.    Cardiovascular: Negative.   Gastrointestinal: Negative.   Musculoskeletal:  Positive for arthralgias (L shoulder pain) and back pain. Negative for gait problem, joint swelling, myalgias, neck pain and neck stiffness.  Skin: Negative.   Neurological: Negative.   Psychiatric/Behavioral: Negative.      Per HPI unless specifically indicated above     Objective:    BP 125/80   Pulse (!) 57   Temp 97.6 F (36.4 C)   Ht 6' (1.829 m)   Wt 221 lb 12.8 oz (100.6 kg)   SpO2 98%   BMI 30.08 kg/m   Wt Readings from Last 3 Encounters:  08/28/21 221 lb 12.8  oz (100.6 kg)  04/06/18 214 lb (97.1 kg)  12/04/15 220 lb 12.8 oz (100.2 kg)    Physical Exam Vitals and nursing note reviewed.  Constitutional:      General: He is not in acute distress.    Appearance: Normal appearance. He is normal weight. He is not ill-appearing, toxic-appearing or diaphoretic.  HENT:     Head: Normocephalic and atraumatic.     Right Ear: External ear normal.     Left Ear: External ear normal.     Nose: Nose normal.     Mouth/Throat:     Mouth: Mucous membranes are moist.     Pharynx: Oropharynx is clear.  Eyes:     General:  No scleral icterus.       Right eye: No discharge.        Left eye: No discharge.     Extraocular Movements: Extraocular movements intact.     Conjunctiva/sclera: Conjunctivae normal.     Pupils: Pupils are equal, round, and reactive to light.  Cardiovascular:     Rate and Rhythm: Normal rate and regular rhythm.     Pulses: Normal pulses.     Heart sounds: Normal heart sounds. No murmur heard.    No friction rub. No gallop.  Pulmonary:     Effort: Pulmonary effort is normal. No respiratory distress.     Breath sounds: Normal breath sounds. No stridor. No wheezing, rhonchi or rales.  Chest:     Chest wall: No tenderness.  Musculoskeletal:        General: Normal range of motion.     Cervical back: Normal range of motion and neck supple.  Skin:    General: Skin is warm and dry.     Capillary Refill: Capillary refill takes less than 2 seconds.     Coloration: Skin is not jaundiced or pale.     Findings: No bruising, erythema, lesion or rash.  Neurological:     General: No focal deficit present.     Mental Status: He is alert and oriented to person, place, and time. Mental status is at baseline.  Psychiatric:        Mood and Affect: Mood normal.        Behavior: Behavior normal.        Thought Content: Thought content normal.        Judgment: Judgment normal.     Results for orders placed or performed in visit on 12/04/15   Comprehensive metabolic panel  Result Value Ref Range   Glucose 95 65 - 99 mg/dL   BUN 18 6 - 24 mg/dL   Creatinine, Ser 1.18 0.76 - 1.27 mg/dL   GFR calc non Af Amer 74 >59 mL/min/1.73   GFR calc Af Amer 85 >59 mL/min/1.73   BUN/Creatinine Ratio 15 9 - 20   Sodium 141 134 - 144 mmol/L   Potassium 4.3 3.5 - 5.2 mmol/L   Chloride 101 96 - 106 mmol/L   CO2 25 18 - 29 mmol/L   Calcium 9.4 8.7 - 10.2 mg/dL   Total Protein 7.1 6.0 - 8.5 g/dL   Albumin 4.5 3.5 - 5.5 g/dL   Globulin, Total 2.6 1.5 - 4.5 g/dL   Albumin/Globulin Ratio 1.7 1.2 - 2.2   Bilirubin Total 0.5 0.0 - 1.2 mg/dL   Alkaline Phosphatase 78 39 - 117 IU/L   AST 29 0 - 40 IU/L   ALT 64 (H) 0 - 44 IU/L  CBC with Differential/Platelet  Result Value Ref Range   WBC 4.1 3.4 - 10.8 x10E3/uL   RBC 4.90 4.14 - 5.80 x10E6/uL   Hemoglobin 14.7 12.6 - 17.7 g/dL   Hematocrit 43.2 37.5 - 51.0 %   MCV 88 79 - 97 fL   MCH 30.0 26.6 - 33.0 pg   MCHC 34.0 31.5 - 35.7 g/dL   RDW 12.9 12.3 - 15.4 %   Platelets 155 150 - 379 x10E3/uL   Neutrophils 55 Not Estab. %   Lymphs 32 Not Estab. %   Monocytes 9 Not Estab. %   Eos 2 Not Estab. %   Basos 1 Not Estab. %   Neutrophils Absolute 2.3 1.4 - 7.0 x10E3/uL   Lymphocytes Absolute 1.3  0.7 - 3.1 x10E3/uL   Monocytes Absolute 0.4 0.1 - 0.9 x10E3/uL   EOS (ABSOLUTE) 0.1 0.0 - 0.4 x10E3/uL   Basophils Absolute 0.0 0.0 - 0.2 x10E3/uL   Immature Granulocytes 1 Not Estab. %   Immature Grans (Abs) 0.0 0.0 - 0.1 x10E3/uL  Lipid Panel w/o Chol/HDL Ratio  Result Value Ref Range   Cholesterol, Total 197 100 - 199 mg/dL   Triglycerides 277 (H) 0 - 149 mg/dL   HDL 39 (L) >39 mg/dL   VLDL Cholesterol Cal 55 (H) 5 - 40 mg/dL   LDL Calculated 103 (H) 0 - 99 mg/dL  TSH  Result Value Ref Range   TSH 3.140 0.450 - 4.500 uIU/mL      Assessment & Plan:   Problem List Items Addressed This Visit       Cardiovascular and Mediastinum   Hypertension - Primary    Under good control on current  regimen. Continue current regimen. Continue to monitor. Call with any concerns. Refills given. Labs drawn today.        Relevant Medications   lisinopril (ZESTRIL) 20 MG tablet   atorvastatin (LIPITOR) 10 MG tablet   Other Relevant Orders   Comprehensive metabolic panel   CBC with Differential/Platelet   TSH   Urinalysis, Routine w reflex microscopic   Microalbumin, Urine Waived     Other   Family history of colon cancer    Due for a repeat colonoscopy in January 2024. Will get him referred out. Call with any concerns.       Relevant Orders   Ambulatory referral to Gastroenterology   Hyperlipidemia    Under good control on current regimen. Continue current regimen. Continue to monitor. Call with any concerns. Refills given. Labs drawn today.        Relevant Medications   lisinopril (ZESTRIL) 20 MG tablet   atorvastatin (LIPITOR) 10 MG tablet   Other Relevant Orders   Comprehensive metabolic panel   CBC with Differential/Platelet   Lipid Panel w/o Chol/HDL Ratio   Other Visit Diagnoses     Chronic bilateral low back pain without sciatica       Will treat with stretches and muscle relaxors. Call with any concerns. Continue to monitor. Call with any concerns.    Relevant Medications   cyclobenzaprine (FLEXERIL) 10 MG tablet   Screening for colon cancer       Due for a repeat colonoscopy in January 2024. Will get him referred out. Call with any concerns.    Relevant Orders   Ambulatory referral to Gastroenterology   Need for hepatitis C screening test       Labs drawn today. Await resuts.    Relevant Orders   Hepatitis C Antibody   Screening for prostate cancer       Labs drawn today. Await resuts.    Relevant Orders   PSA        Follow up plan: Return in about 6 months (around 02/28/2022) for physical .   30 minutes spent with patient today.

## 2021-08-28 NOTE — Assessment & Plan Note (Signed)
Due for a repeat colonoscopy in January 2024. Will get him referred out. Call with any concerns.

## 2021-08-29 ENCOUNTER — Encounter: Payer: Self-pay | Admitting: Family Medicine

## 2021-08-29 LAB — COMPREHENSIVE METABOLIC PANEL
ALT: 32 IU/L (ref 0–44)
AST: 23 IU/L (ref 0–40)
Albumin/Globulin Ratio: 1.8 (ref 1.2–2.2)
Albumin: 4.7 g/dL (ref 3.8–4.9)
Alkaline Phosphatase: 73 IU/L (ref 44–121)
BUN/Creatinine Ratio: 17 (ref 9–20)
BUN: 19 mg/dL (ref 6–24)
Bilirubin Total: 0.4 mg/dL (ref 0.0–1.2)
CO2: 21 mmol/L (ref 20–29)
Calcium: 9.5 mg/dL (ref 8.7–10.2)
Chloride: 104 mmol/L (ref 96–106)
Creatinine, Ser: 1.13 mg/dL (ref 0.76–1.27)
Globulin, Total: 2.6 g/dL (ref 1.5–4.5)
Glucose: 99 mg/dL (ref 70–99)
Potassium: 4.4 mmol/L (ref 3.5–5.2)
Sodium: 140 mmol/L (ref 134–144)
Total Protein: 7.3 g/dL (ref 6.0–8.5)
eGFR: 79 mL/min/{1.73_m2} (ref >59)

## 2021-08-29 LAB — CBC WITH DIFFERENTIAL/PLATELET
Basophils Absolute: 0 10*3/uL (ref 0.0–0.2)
Basos: 1 %
EOS (ABSOLUTE): 0.1 10*3/uL (ref 0.0–0.4)
Eos: 2 %
Hematocrit: 44 % (ref 37.5–51.0)
Hemoglobin: 15.2 g/dL (ref 13.0–17.7)
Immature Grans (Abs): 0 10*3/uL (ref 0.0–0.1)
Immature Granulocytes: 0 %
Lymphocytes Absolute: 1.4 10*3/uL (ref 0.7–3.1)
Lymphs: 25 %
MCH: 30.3 pg (ref 26.6–33.0)
MCHC: 34.5 g/dL (ref 31.5–35.7)
MCV: 88 fL (ref 79–97)
Monocytes Absolute: 0.5 10*3/uL (ref 0.1–0.9)
Monocytes: 9 %
Neutrophils Absolute: 3.4 10*3/uL (ref 1.4–7.0)
Neutrophils: 63 %
Platelets: 151 10*3/uL (ref 150–450)
RBC: 5.02 x10E6/uL (ref 4.14–5.80)
RDW: 12.9 % (ref 11.6–15.4)
WBC: 5.4 10*3/uL (ref 3.4–10.8)

## 2021-08-29 LAB — LIPID PANEL W/O CHOL/HDL RATIO
Cholesterol, Total: 187 mg/dL (ref 100–199)
HDL: 39 mg/dL — ABNORMAL LOW
LDL Chol Calc (NIH): 112 mg/dL — ABNORMAL HIGH (ref 0–99)
Triglycerides: 205 mg/dL — ABNORMAL HIGH (ref 0–149)
VLDL Cholesterol Cal: 36 mg/dL (ref 5–40)

## 2021-08-29 LAB — TSH: TSH: 2.91 u[IU]/mL (ref 0.450–4.500)

## 2021-08-29 LAB — PSA: Prostate Specific Ag, Serum: 0.5 ng/mL (ref 0.0–4.0)

## 2021-08-29 LAB — HEPATITIS C ANTIBODY: Hep C Virus Ab: NONREACTIVE

## 2021-09-12 DIAGNOSIS — H9201 Otalgia, right ear: Secondary | ICD-10-CM | POA: Diagnosis not present

## 2021-10-30 ENCOUNTER — Telehealth: Payer: Self-pay | Admitting: Family Medicine

## 2021-10-30 NOTE — Telephone Encounter (Signed)
Pt called to see if the change from Crestor to atorvastatin (LIPITOR) 10 MG tablet was done by mistake or on purpose / please advise

## 2021-10-31 NOTE — Telephone Encounter (Signed)
We had in his notes that he was taking atorvastatin. Had he previously been on crestor? We can change him back or check his labs to see how he's doing next visit. His choice.

## 2021-10-31 NOTE — Telephone Encounter (Signed)
Please advise patient was seen in July to establish care, had been on Crestor before establishing here.

## 2021-10-31 NOTE — Telephone Encounter (Signed)
Patient states he is fine with staying on medication he is on now.

## 2022-03-04 ENCOUNTER — Encounter: Payer: Self-pay | Admitting: Family Medicine

## 2022-03-04 ENCOUNTER — Ambulatory Visit (INDEPENDENT_AMBULATORY_CARE_PROVIDER_SITE_OTHER): Payer: 59 | Admitting: Family Medicine

## 2022-03-04 ENCOUNTER — Other Ambulatory Visit: Payer: Self-pay

## 2022-03-04 ENCOUNTER — Telehealth: Payer: Self-pay

## 2022-03-04 VITALS — BP 113/71 | HR 59 | Temp 98.6°F | Ht 72.25 in | Wt 229.9 lb

## 2022-03-04 DIAGNOSIS — Z86010 Personal history of colonic polyps: Secondary | ICD-10-CM

## 2022-03-04 DIAGNOSIS — E785 Hyperlipidemia, unspecified: Secondary | ICD-10-CM | POA: Diagnosis not present

## 2022-03-04 DIAGNOSIS — Z1211 Encounter for screening for malignant neoplasm of colon: Secondary | ICD-10-CM | POA: Diagnosis not present

## 2022-03-04 DIAGNOSIS — Z0000 Encounter for general adult medical examination without abnormal findings: Secondary | ICD-10-CM

## 2022-03-04 DIAGNOSIS — I10 Essential (primary) hypertension: Secondary | ICD-10-CM

## 2022-03-04 DIAGNOSIS — Z800 Family history of malignant neoplasm of digestive organs: Secondary | ICD-10-CM

## 2022-03-04 DIAGNOSIS — Z23 Encounter for immunization: Secondary | ICD-10-CM

## 2022-03-04 LAB — URINALYSIS, ROUTINE W REFLEX MICROSCOPIC
Bilirubin, UA: NEGATIVE
Glucose, UA: NEGATIVE
Ketones, UA: NEGATIVE
Leukocytes,UA: NEGATIVE
Nitrite, UA: NEGATIVE
Protein,UA: NEGATIVE
RBC, UA: NEGATIVE
Specific Gravity, UA: 1.02 (ref 1.005–1.030)
Urobilinogen, Ur: 0.2 mg/dL (ref 0.2–1.0)
pH, UA: 5.5 (ref 5.0–7.5)

## 2022-03-04 LAB — MICROALBUMIN, URINE WAIVED
Creatinine, Urine Waived: 100 mg/dL (ref 10–300)
Microalb, Ur Waived: 10 mg/L (ref 0–19)
Microalb/Creat Ratio: <30 mg/g (ref <30)

## 2022-03-04 MED ORDER — LISINOPRIL 20 MG PO TABS: 20.0000 mg | ORAL_TABLET | Freq: Every day | ORAL | 1 refills | Status: DC

## 2022-03-04 MED ORDER — NA SULFATE-K SULFATE-MG SULF 17.5-3.13-1.6 GM/177ML PO SOLN: 1.0000 | Freq: Once | ORAL | 0 refills | Status: AC

## 2022-03-04 MED ORDER — ATORVASTATIN CALCIUM 10 MG PO TABS: 10.0000 mg | ORAL_TABLET | Freq: Every day | ORAL | 1 refills | Status: DC

## 2022-03-04 NOTE — Telephone Encounter (Signed)
Gastroenterology Pre-Procedure Review  Request Date: 03/31/22 Requesting Physician: Dr. Vicente Males  PATIENT REVIEW QUESTIONS: The patient responded to the following health history questions as indicated:    1. Are you having any GI issues? no 2. Do you have a personal history of Polyps? yes (last colonoscopy performed by Dr. Carlean Purl at [facility] on 02/07/2015 noted sessile polyp at descending colon) 3. Do you have a family history of Colon Cancer or Polyps? yes (father colon cancer) 4. Diabetes Mellitus? no 5. Joint replacements in the past 12 months?no 6. Major health problems in the past 3 months?no 7. Any artificial heart valves, MVP, or defibrillator?no    MEDICATIONS & ALLERGIES:    Patient reports the following regarding taking any anticoagulation/antiplatelet therapy:   Plavix, Coumadin, Eliquis, Xarelto, Lovenox, Pradaxa, Brilinta, or Effient? no Aspirin? no  Patient confirms/reports the following medications:  Current Outpatient Medications  Medication Sig Dispense Refill   albuterol (VENTOLIN HFA) 108 (90 Base) MCG/ACT inhaler Inhale into the lungs. (Patient not taking: Reported on 03/04/2022)     atorvastatin (LIPITOR) 10 MG tablet Take 1 tablet (10 mg total) by mouth daily. 90 tablet 1   calcium carbonate (TUMS - DOSED IN MG ELEMENTAL CALCIUM) 500 MG chewable tablet Chew 1 tablet by mouth daily. Reported on 02/07/2015     cyclobenzaprine (FLEXERIL) 10 MG tablet Take 1 tablet (10 mg total) by mouth at bedtime as needed for muscle spasms. 30 tablet 3   lisinopril (ZESTRIL) 20 MG tablet Take 1 tablet (20 mg total) by mouth daily. 90 tablet 1   No current facility-administered medications for this visit.    Patient confirms/reports the following allergies:  Allergies  Allergen Reactions   Pravastatin Other (See Comments)    Hard stools    No orders of the defined types were placed in this encounter.   AUTHORIZATION INFORMATION Primary Insurance: 1D#: Group #:  Secondary  Insurance: 1D#: Group #:  SCHEDULE INFORMATION: Date: 03/31/22 Time: Location: [facility]

## 2022-03-04 NOTE — Assessment & Plan Note (Signed)
Under good control on current regimen. Continue current regimen. Continue to monitor. Call with any concerns. Refills given. Labs drawn today.

## 2022-03-04 NOTE — Telephone Encounter (Signed)
Pt requested to reschedule from 03/31/22 to 04/02/22.  Trish in Endo notified.  Referral update messaged to [place].  New instructions printed.  Thanks, [place], [place]

## 2022-03-04 NOTE — Progress Notes (Signed)
BP 113/71   Pulse (!) 59   Temp 98.6 F (37 C) (Oral)   Ht 6' 0.25" (1.835 m)   Wt 229 lb 14.4 oz (104.3 kg)   SpO2 97%   BMI 30.96 kg/m    Subjective:    Patient ID: George Chang, male    DOB: 08-Dec-1970, 52 y.o.   MRN: [id number]  HPI: George Chang is a 52 y.o. male presenting on 03/04/2022 for comprehensive medical examination. Current medical complaints include:  HYPERTENSION / HYPERLIPIDEMIA Satisfied with current treatment? yes Duration of hypertension: chronic BP monitoring frequency: not checking BP medication side effects: no Past BP meds: lisinopril Duration of hyperlipidemia: chronic Cholesterol medication side effects: no Cholesterol supplements: none Past cholesterol medications: atorvastatin Medication compliance: excellent compliance Aspirin: no Recent stressors: no Recurrent headaches: no Visual changes: no Palpitations: no Dyspnea: no Chest pain: no Lower extremity edema: no Dizzy/lightheaded: no  Interim Problems from his last visit: no  Depression Screen done today and results listed below:     03/04/2022    8:10 AM 08/28/2021    8:19 AM  Depression screen PHQ 2/9  Decreased Interest 0 1  Down, Depressed, Hopeless 0 0  PHQ - 2 Score 0 1  Altered sleeping 0 1  Tired, decreased energy 1 1  Change in appetite 0 0  Feeling bad or failure about yourself  0 0  Trouble concentrating 0 0  Moving slowly or fidgety/restless 0 0  Suicidal thoughts 0 0  PHQ-9 Score 1 3  Difficult doing work/chores Not difficult at all Not difficult at all     Past Medical History:  Past Medical History:  Diagnosis Date   Anal fissure 02/14/2015   Back pain    Environmental allergies    GERD (gastroesophageal reflux disease)    Personal history of colonic polyps 02/14/2015   Plantar fasciitis     Surgical History:  Past Surgical History:  Procedure Laterality Date   COLONOSCOPY      Medications:  Current Outpatient Medications on File Prior to  Visit  Medication Sig   calcium carbonate (TUMS - DOSED IN MG ELEMENTAL CALCIUM) 500 MG chewable tablet Chew 1 tablet by mouth daily. Reported on 02/07/2015   cyclobenzaprine (FLEXERIL) 10 MG tablet Take 1 tablet (10 mg total) by mouth at bedtime as needed for muscle spasms.   albuterol (VENTOLIN HFA) 108 (90 Base) MCG/ACT inhaler Inhale into the lungs. (Patient not taking: Reported on 03/04/2022)   No current facility-administered medications on file prior to visit.    Allergies:  Allergies  Allergen Reactions   Pravastatin Other (See Comments)    Hard stools    Social History:  Social History   Socioeconomic History   Marital status: Single    Spouse name: Not on file   Number of children: Not on file   Years of education: Not on file   Highest education level: Not on file  Occupational History   Not on file  Tobacco Use   Smoking status: Former    Types: Cigarettes    Quit date: 10/07/1996    Years since quitting: 25.4   Smokeless tobacco: Never  Vaping Use   Vaping Use: Never used  Substance and Sexual Activity   Alcohol use: Yes    Alcohol/week: 12.0 standard drinks of alcohol    Types: 12 Cans of beer per week   Drug use: No   Sexual activity: Yes  Other Topics Concern   Not on file  Social History Narrative   Not on file   Social Determinants of Health   Financial Resource Strain: Not on file  Food Insecurity: Not on file  Transportation Needs: Not on file  Physical Activity: Not on file  Stress: Not on file  Social Connections: Not on file  Intimate Partner Violence: Not on file   Social History   Tobacco Use  Smoking Status Former   Types: Cigarettes   Quit date: 10/07/1996   Years since quitting: 25.4  Smokeless Tobacco Never   Social History   Substance and Sexual Activity  Alcohol Use Yes   Alcohol/week: 12.0 standard drinks of alcohol   Types: 12 Cans of beer per week    Family History:  Family History  Problem Relation Age of Onset    Cancer Father        colon   Colon cancer Father 20       expired at 10 as well   Hyperlipidemia Mother     Past medical history, surgical history, medications, allergies, family history and social history reviewed with patient today and changes made to appropriate areas of the chart.   Review of Systems  Constitutional: Negative.   HENT: Negative.    Eyes: Negative.   Cardiovascular: Negative.   Gastrointestinal: Negative.   Genitourinary: Negative.   Musculoskeletal: Negative.   Skin: Negative.   Neurological:  Positive for tingling. Negative for dizziness, tremors, sensory change, speech change, focal weakness, seizures, loss of consciousness, weakness and headaches.  Endo/Heme/Allergies: Negative.   Psychiatric/Behavioral: Negative.     All other ROS negative except what is listed above and in the HPI.      Objective:    BP 113/71   Pulse (!) 59   Temp 98.6 F (37 C) (Oral)   Ht 6' 0.25" (1.835 m)   Wt 229 lb 14.4 oz (104.3 kg)   SpO2 97%   BMI 30.96 kg/m   Wt Readings from Last 3 Encounters:  03/04/22 229 lb 14.4 oz (104.3 kg)  08/28/21 221 lb 12.8 oz (100.6 kg)  04/06/18 214 lb (97.1 kg)    Physical Exam Vitals and nursing note reviewed.  Constitutional:      General: He is not in acute distress.    Appearance: Normal appearance. He is not ill-appearing, toxic-appearing or diaphoretic.  HENT:     Head: Normocephalic and atraumatic.     Right Ear: Tympanic membrane, ear canal and external ear normal. There is no impacted cerumen.     Left Ear: Tympanic membrane, ear canal and external ear normal. There is no impacted cerumen.     Nose: Nose normal. No congestion or rhinorrhea.     Mouth/Throat:     Mouth: Mucous membranes are moist.     Pharynx: Oropharynx is clear. No oropharyngeal exudate or posterior oropharyngeal erythema.  Eyes:     General: No scleral icterus.       Right eye: No discharge.        Left eye: No discharge.     Extraocular Movements:  Extraocular movements intact.     Conjunctiva/sclera: Conjunctivae normal.     Pupils: Pupils are equal, round, and reactive to light.  Neck:     Vascular: No carotid bruit.  Cardiovascular:     Rate and Rhythm: Normal rate and regular rhythm.     Pulses: Normal pulses.     Heart sounds: No murmur heard.    No friction rub. No gallop.  Pulmonary:  Effort: Pulmonary effort is normal. No respiratory distress.     Breath sounds: Normal breath sounds. No stridor. No wheezing, rhonchi or rales.  Chest:     Chest wall: No tenderness.  Abdominal:     General: Abdomen is flat. Bowel sounds are normal. There is no distension.     Palpations: Abdomen is soft. There is no mass.     Tenderness: There is no abdominal tenderness. There is no right CVA tenderness, left CVA tenderness, guarding or rebound.     Hernia: No hernia is present.  Genitourinary:    Comments: Genital exam deferred with shared decision making Musculoskeletal:        General: No swelling, tenderness, deformity or signs of injury.     Cervical back: Normal range of motion and neck supple. No rigidity. No muscular tenderness.     Right lower leg: No edema.     Left lower leg: No edema.  Lymphadenopathy:     Cervical: No cervical adenopathy.  Skin:    General: Skin is warm and dry.     Capillary Refill: Capillary refill takes less than 2 seconds.     Coloration: Skin is not jaundiced or pale.     Findings: No bruising, erythema, lesion or rash.  Neurological:     General: No focal deficit present.     Mental Status: He is alert and oriented to person, place, and time.     Cranial Nerves: No cranial nerve deficit.     Sensory: No sensory deficit.     Motor: No weakness.     Coordination: Coordination normal.     Gait: Gait normal.     Deep Tendon Reflexes: Reflexes normal.  Psychiatric:        Mood and Affect: Mood normal.        Behavior: Behavior normal.        Thought Content: Thought content normal.         Judgment: Judgment normal.     Results for orders placed or performed in visit on 08/28/21  Comprehensive metabolic panel  Result Value Ref Range   Glucose 99 70 - 99 mg/dL   BUN 19 6 - 24 mg/dL   Creatinine, Ser 1.13 0.76 - 1.27 mg/dL   eGFR 79 >59 mL/min/1.73   BUN/Creatinine Ratio 17 9 - 20   Sodium 140 134 - 144 mmol/L   Potassium 4.4 3.5 - 5.2 mmol/L   Chloride 104 96 - 106 mmol/L   CO2 21 20 - 29 mmol/L   Calcium 9.5 8.7 - 10.2 mg/dL   Total Protein 7.3 6.0 - 8.5 g/dL   Albumin 4.7 3.8 - 4.9 g/dL   Globulin, Total 2.6 1.5 - 4.5 g/dL   Albumin/Globulin Ratio 1.8 1.2 - 2.2   Bilirubin Total 0.4 0.0 - 1.2 mg/dL   Alkaline Phosphatase 73 44 - 121 IU/L   AST 23 0 - 40 IU/L   ALT 32 0 - 44 IU/L  CBC with Differential/Platelet  Result Value Ref Range   WBC 5.4 3.4 - 10.8 x10E3/uL   RBC 5.02 4.14 - 5.80 x10E6/uL   Hemoglobin 15.2 13.0 - 17.7 g/dL   Hematocrit 44.0 37.5 - 51.0 %   MCV 88 79 - 97 fL   MCH 30.3 26.6 - 33.0 pg   MCHC 34.5 31.5 - 35.7 g/dL   RDW 12.9 11.6 - 15.4 %   Platelets 151 150 - 450 x10E3/uL   Neutrophils 63 Not Estab. %   Lymphs  25 Not Estab. %   Monocytes 9 Not Estab. %   Eos 2 Not Estab. %   Basos 1 Not Estab. %   Neutrophils Absolute 3.4 1.4 - 7.0 x10E3/uL   Lymphocytes Absolute 1.4 0.7 - 3.1 x10E3/uL   Monocytes Absolute 0.5 0.1 - 0.9 x10E3/uL   EOS (ABSOLUTE) 0.1 0.0 - 0.4 x10E3/uL   Basophils Absolute 0.0 0.0 - 0.2 x10E3/uL   Immature Granulocytes 0 Not Estab. %   Immature Grans (Abs) 0.0 0.0 - 0.1 x10E3/uL  Lipid Panel w/o Chol/HDL Ratio  Result Value Ref Range   Cholesterol, Total 187 100 - 199 mg/dL   Triglycerides 205 (H) 0 - 149 mg/dL   HDL 39 (L) >39 mg/dL   VLDL Cholesterol Cal 36 5 - 40 mg/dL   LDL Chol Calc (NIH) 112 (H) 0 - 99 mg/dL  PSA  Result Value Ref Range   Prostate Specific Ag, Serum 0.5 0.0 - 4.0 ng/mL  TSH  Result Value Ref Range   TSH 2.910 0.450 - 4.500 uIU/mL  Urinalysis, Routine w reflex microscopic  Result  Value Ref Range   Specific Gravity, UA >1.030 (H) 1.005 - 1.030   pH, UA 5.5 5.0 - 7.5   Color, UA Yellow Yellow   Appearance Ur Clear Clear   Leukocytes,UA Negative Negative   Protein,UA Negative Negative/Trace   Glucose, UA Negative Negative   Ketones, UA Negative Negative   RBC, UA Negative Negative   Bilirubin, UA Negative Negative   Urobilinogen, Ur 0.2 0.2 - 1.0 mg/dL   Nitrite, UA Negative Negative  Microalbumin, Urine Waived  Result Value Ref Range   Microalb, Ur Waived 10 0 - 19 mg/L   Creatinine, Urine Waived 100 10 - 300 mg/dL   Microalb/Creat Ratio <30 <30 mg/g  Hepatitis C Antibody  Result Value Ref Range   Hep C Virus Ab Non Reactive Non Reactive      Assessment & Plan:   Problem List Items Addressed This Visit       Cardiovascular and Mediastinum   Hypertension    Under good control on current regimen. Continue current regimen. Continue to monitor. Call with any concerns. Refills given. Labs drawn today.       Relevant Medications   atorvastatin (LIPITOR) 10 MG tablet   lisinopril (ZESTRIL) 20 MG tablet   Other Relevant Orders   Urinalysis, Routine w reflex microscopic   Microalbumin, Urine Waived     Other   Hyperlipidemia    Under good control on current regimen. Continue current regimen. Continue to monitor. Call with any concerns. Refills given. Labs drawn today.      Relevant Medications   atorvastatin (LIPITOR) 10 MG tablet   lisinopril (ZESTRIL) 20 MG tablet   Other Relevant Orders   Lipid Panel w/o Chol/HDL Ratio   Other Visit Diagnoses     Screening for colon cancer    -  Primary   Referral to GI placed today.   Relevant Orders   Ambulatory referral to Gastroenterology   Routine general medical examination at a health care facility       Vaccines up to date/declined. Screening labs checked today. Colonoscopy ordered. Continue diet and exercise. Call with any concerns.   Relevant Orders   Comprehensive metabolic panel   CBC with  Differential/Platelet   Lipid Panel w/o Chol/HDL Ratio   PSA   TSH        LABORATORY TESTING:  Health maintenance labs ordered today as discussed above.  The natural history of prostate cancer and ongoing controversy regarding screening and potential treatment outcomes of prostate cancer has been discussed with the patient. The meaning of a false positive PSA and a false negative PSA has been discussed. He indicates understanding of the limitations of this screening test and wishes to proceed with screening PSA testing.   IMMUNIZATIONS:   - Tdap: Tetanus vaccination status reviewed: Td vaccination indicated and given today. - Influenza: Refused - Pneumovax: Not applicable - Prevnar: Not applicable - COVID: Refused - HPV: Not applicable - Shingrix vaccine: Refused  SCREENING: - Colonoscopy: Ordered today  Discussed with patient purpose of the colonoscopy is to detect colon cancer at curable precancerous or early stages   PATIENT COUNSELING:    Sexuality: Discussed sexually transmitted diseases, partner selection, use of condoms, avoidance of unintended pregnancy  and contraceptive alternatives.   Advised to avoid cigarette smoking.  I discussed with the patient that most people either abstain from alcohol or drink within safe limits (<=14/week and <=4 drinks/occasion for males, <=7/weeks and <= 3 drinks/occasion for females) and that the risk for alcohol disorders and other health effects rises proportionally with the number of drinks per week and how often a drinker exceeds daily limits.  Discussed cessation/primary prevention of drug use and availability of treatment for abuse.   Diet: Encouraged to adjust caloric intake to maintain  or achieve ideal body weight, to reduce intake of dietary saturated fat and total fat, to limit sodium intake by avoiding high sodium foods and not adding table salt, and to maintain adequate dietary potassium and calcium preferably from fresh  fruits, vegetables, and low-fat dairy products.    stressed the importance of regular exercise  Injury prevention: Discussed safety belts, safety helmets, smoke detector, smoking near bedding or upholstery.   Dental health: Discussed importance of regular tooth brushing, flossing, and dental visits.   Follow up plan: NEXT PREVENTATIVE PHYSICAL DUE IN 1 YEAR. Return in about 6 months (around 09/02/2022).

## 2022-03-05 ENCOUNTER — Encounter: Payer: Self-pay | Admitting: Family Medicine

## 2022-03-05 LAB — CBC WITH DIFFERENTIAL/PLATELET
Basophils Absolute: 0 10*3/uL (ref 0.0–0.2)
Basos: 1 %
EOS (ABSOLUTE): 0.1 10*3/uL (ref 0.0–0.4)
Eos: 1 %
Hematocrit: 43.5 % (ref 37.5–51.0)
Hemoglobin: 15 g/dL (ref 13.0–17.7)
Immature Grans (Abs): 0 10*3/uL (ref 0.0–0.1)
Immature Granulocytes: 0 %
Lymphocytes Absolute: 1.4 10*3/uL (ref 0.7–3.1)
Lymphs: 31 %
MCH: 29.8 pg (ref 26.6–33.0)
MCHC: 34.5 g/dL (ref 31.5–35.7)
MCV: 87 fL (ref 79–97)
Monocytes Absolute: 0.5 10*3/uL (ref 0.1–0.9)
Monocytes: 10 %
Neutrophils Absolute: 2.6 10*3/uL (ref 1.4–7.0)
Neutrophils: 57 %
Platelets: 178 10*3/uL (ref 150–450)
RBC: 5.03 x10E6/uL (ref 4.14–5.80)
RDW: 13 % (ref 11.6–15.4)
WBC: 4.5 10*3/uL (ref 3.4–10.8)

## 2022-03-05 LAB — LIPID PANEL W/O CHOL/HDL RATIO
Cholesterol, Total: 199 mg/dL (ref 100–199)
HDL: 39 mg/dL — ABNORMAL LOW (ref >39)
LDL Chol Calc (NIH): 116 mg/dL — ABNORMAL HIGH (ref 0–99)
Triglycerides: 253 mg/dL — ABNORMAL HIGH (ref 0–149)
VLDL Cholesterol Cal: 44 mg/dL — ABNORMAL HIGH (ref 5–40)

## 2022-03-05 LAB — PSA: Prostate Specific Ag, Serum: 0.5 ng/mL (ref 0.0–4.0)

## 2022-03-05 LAB — COMPREHENSIVE METABOLIC PANEL
ALT: 42 IU/L (ref 0–44)
AST: 23 IU/L (ref 0–40)
Albumin/Globulin Ratio: 1.9 (ref 1.2–2.2)
Albumin: 4.9 g/dL (ref 3.8–4.9)
Alkaline Phosphatase: 81 IU/L (ref 44–121)
BUN/Creatinine Ratio: 14 (ref 9–20)
BUN: 16 mg/dL (ref 6–24)
Bilirubin Total: 0.4 mg/dL (ref 0.0–1.2)
CO2: 22 mmol/L (ref 20–29)
Calcium: 9.7 mg/dL (ref 8.7–10.2)
Chloride: 102 mmol/L (ref 96–106)
Creatinine, Ser: 1.17 mg/dL (ref 0.76–1.27)
Globulin, Total: 2.6 g/dL (ref 1.5–4.5)
Glucose: 106 mg/dL — ABNORMAL HIGH (ref 70–99)
Potassium: 4.6 mmol/L (ref 3.5–5.2)
Sodium: 139 mmol/L (ref 134–144)
Total Protein: 7.5 g/dL (ref 6.0–8.5)
eGFR: 75 mL/min/{1.73_m2} (ref >59)

## 2022-03-05 LAB — TSH: TSH: 2.85 u[IU]/mL (ref 0.450–4.500)

## 2022-04-01 ENCOUNTER — Encounter: Payer: Self-pay | Admitting: Gastroenterology

## 2022-04-02 ENCOUNTER — Ambulatory Visit: Payer: 59 | Admitting: General Practice

## 2022-04-02 ENCOUNTER — Ambulatory Visit
Admission: RE | Admit: 2022-04-02 | Discharge: 2022-04-02 | Disposition: A | Discharge status: Home / Self Care | Payer: 59 | Attending: Gastroenterology | Admitting: Gastroenterology

## 2022-04-02 ENCOUNTER — Encounter
Admission: RE | Disposition: A | Discharge status: Home / Self Care | Payer: Self-pay | Source: Home / Self Care | Attending: Gastroenterology

## 2022-04-02 DIAGNOSIS — I10 Essential (primary) hypertension: Secondary | ICD-10-CM | POA: Diagnosis not present

## 2022-04-02 DIAGNOSIS — K219 Gastro-esophageal reflux disease without esophagitis: Secondary | ICD-10-CM | POA: Diagnosis not present

## 2022-04-02 DIAGNOSIS — Z87891 Personal history of nicotine dependence: Secondary | ICD-10-CM | POA: Insufficient documentation

## 2022-04-02 DIAGNOSIS — D126 Benign neoplasm of colon, unspecified: Secondary | ICD-10-CM | POA: Diagnosis not present

## 2022-04-02 DIAGNOSIS — Z800 Family history of malignant neoplasm of digestive organs: Secondary | ICD-10-CM

## 2022-04-02 DIAGNOSIS — Z860100 Personal history of colon polyps, unspecified: Secondary | ICD-10-CM

## 2022-04-02 DIAGNOSIS — Z09 Encounter for follow-up examination after completed treatment for conditions other than malignant neoplasm: Secondary | ICD-10-CM | POA: Diagnosis not present

## 2022-04-02 DIAGNOSIS — K635 Polyp of colon: Secondary | ICD-10-CM | POA: Diagnosis not present

## 2022-04-02 DIAGNOSIS — Z1211 Encounter for screening for malignant neoplasm of colon: Secondary | ICD-10-CM | POA: Diagnosis not present

## 2022-04-02 HISTORY — PX: COLONOSCOPY WITH PROPOFOL: SHX5780

## 2022-04-02 SURGERY — COLONOSCOPY WITH PROPOFOL
Anesthesia: General

## 2022-04-02 MED ORDER — PROPOFOL 10 MG/ML IV BOLUS
INTRAVENOUS | Status: AC
  Filled 2022-04-02: qty 40

## 2022-04-02 MED ORDER — SODIUM CHLORIDE 0.9 % IV SOLN
INTRAVENOUS | Status: DC
  Administered 2022-04-02: 20 mL/h via INTRAVENOUS

## 2022-04-02 MED ORDER — PROPOFOL 10 MG/ML IV BOLUS
INTRAVENOUS | Status: DC | PRN
  Administered 2022-04-02: 120 mg via INTRAVENOUS
  Administered 2022-04-02: 120 ug/kg/min via INTRAVENOUS

## 2022-04-02 MED ORDER — LIDOCAINE HCL (PF) 2 % IJ SOLN
INTRAMUSCULAR | Status: AC
  Filled 2022-04-02: qty 5

## 2022-04-02 MED ORDER — LIDOCAINE HCL (CARDIAC) PF 100 MG/5ML IV SOSY
PREFILLED_SYRINGE | INTRAVENOUS | Status: DC | PRN
  Administered 2022-04-02: 100 mg via INTRAVENOUS

## 2022-04-02 MED ORDER — PHENYLEPHRINE HCL (PRESSORS) 10 MG/ML IV SOLN
INTRAVENOUS | Status: DC | PRN
  Administered 2022-04-02 (×2): 80 ug via INTRAVENOUS

## 2022-04-02 NOTE — Transfer of Care (Signed)
Immediate Anesthesia Transfer of Care Note  Patient: George Chang  Procedure(s) Performed: COLONOSCOPY WITH PROPOFOL  Patient Location: PACU  Anesthesia Type:General  Level of Consciousness: awake  Airway & Oxygen Therapy: Patient Spontanous Breathing  Post-op Assessment: Report given to RN and Post -op Vital signs reviewed and stable  Post vital signs: Reviewed and stable  Last Vitals:  Vitals Value Taken Time  BP 101/66 0929  Temp 35.8 0929  Pulse 76 0929  Resp 18 0929  SpO2 98 0929    Last Pain:  Vitals:   04/02/22 0806  TempSrc: Temporal  PainSc: 0-No pain         Complications: No notable events documented.

## 2022-04-02 NOTE — Anesthesia Preprocedure Evaluation (Signed)
Anesthesia Evaluation  Patient identified by MRN, date of birth, ID band Patient awake    Reviewed: Allergy & Precautions, NPO status , Patient's Chart, lab work & pertinent test results  History of Anesthesia Complications Negative for: history of anesthetic complications  Airway Mallampati: III  TM Distance: >3 FB Neck ROM: full    Dental  (+) Chipped, Dental Advidsory Given   Pulmonary neg shortness of breath, asthma , neg COPD, former smoker   Pulmonary exam normal        Cardiovascular hypertension, On Medications (-) Past MI and (-) CABG Normal cardiovascular exam     Neuro/Psych negative neurological ROS  negative psych ROS   GI/Hepatic negative GI ROS, Neg liver ROS,,,  Endo/Other  negative endocrine ROS    Renal/GU negative Renal ROS  negative genitourinary   Musculoskeletal   Abdominal   Peds  Hematology negative hematology ROS (+)   Anesthesia Other Findings Past Medical History: 02/14/2015: Anal fissure No date: Back pain No date: Environmental allergies No date: GERD (gastroesophageal reflux disease) 02/14/2015: Personal history of colonic polyps No date: Plantar fasciitis  Past Surgical History: No date: COLONOSCOPY  BMI    Body Mass Index: 30.03 kg/m      Reproductive/Obstetrics negative OB ROS                             Anesthesia Physical Anesthesia Plan  ASA: 2  Anesthesia Plan: General   Post-op Pain Management: Minimal or no pain anticipated   Induction: Intravenous  PONV Risk Score and Plan: 3 and Propofol infusion, TIVA and Ondansetron  Airway Management Planned: Nasal Cannula  Additional Equipment: None  Intra-op Plan:   Post-operative Plan:   Informed Consent: I have reviewed the patients History and Physical, chart, labs and discussed the procedure including the risks, benefits and alternatives for the proposed anesthesia with the patient  or authorized representative who has indicated his/her understanding and acceptance.     Dental advisory given  Plan Discussed with: CRNA and Surgeon  Anesthesia Plan Comments: (Discussed risks of anesthesia with patient, including possibility of difficulty with spontaneous ventilation under anesthesia necessitating airway intervention, PONV, and rare risks such as cardiac or respiratory or neurological events, and allergic reactions. Discussed the role of CRNA in patient's perioperative care. Patient understands.)       Anesthesia Quick Evaluation

## 2022-04-02 NOTE — Anesthesia Postprocedure Evaluation (Signed)
Anesthesia Post Note  Patient: George Chang  Procedure(s) Performed: COLONOSCOPY WITH PROPOFOL  Patient location during evaluation: Endoscopy Anesthesia Type: General Level of consciousness: awake and alert Pain management: pain level controlled Vital Signs Assessment: post-procedure vital signs reviewed and stable Respiratory status: spontaneous breathing, nonlabored ventilation, respiratory function stable and patient connected to nasal cannula oxygen Cardiovascular status: blood pressure returned to baseline and stable Postop Assessment: no apparent nausea or vomiting Anesthetic complications: no  No notable events documented.   Last Vitals:  Vitals:   04/02/22 0936 04/02/22 0950  BP: (!) 139/96 138/89  Pulse: (!) 59   Resp: 17 18  Temp:    SpO2: 97%     Last Pain:  Vitals:   04/02/22 0950  TempSrc:   PainSc: 0-No pain                 Dimas Millin

## 2022-04-02 NOTE — H&P (Signed)
[name], MD [address], [place], [place], [place], [address] [id number], [place], [place], [place], [address] Phone: [phone]  Fax: [phone]  Primary Care Physician:  Valerie Roys, DO   Pre-Procedure History & Physical: HPI:  George Chang is a 52 y.o. male is here for an colonoscopy.   Past Medical History:  Diagnosis Date   Anal fissure 02/14/2015   Back pain    Environmental allergies    GERD (gastroesophageal reflux disease)    Personal history of colonic polyps 02/14/2015   Plantar fasciitis     Past Surgical History:  Procedure Laterality Date   COLONOSCOPY      Prior to Admission medications   Medication Sig Start Date End Date Taking? Authorizing Provider  atorvastatin (LIPITOR) 10 MG tablet Take 1 tablet (10 mg total) by mouth daily. 03/04/22  Yes Johnson, Megan P, DO  calcium carbonate (TUMS - DOSED IN MG ELEMENTAL CALCIUM) 500 MG chewable tablet Chew 1 tablet by mouth daily. Reported on 02/07/2015   Yes [provider]  cyclobenzaprine (FLEXERIL) 10 MG tablet Take 1 tablet (10 mg total) by mouth at bedtime as needed for muscle spasms. 08/28/21  Yes Johnson, Megan P, DO  lisinopril (ZESTRIL) 20 MG tablet Take 1 tablet (20 mg total) by mouth daily. 03/04/22  Yes Johnson, Megan P, DO  albuterol (VENTOLIN HFA) 108 (90 Base) MCG/ACT inhaler Inhale into the lungs. Patient not taking: Reported on 03/04/2022 02/15/21   [provider]    Allergies as of 03/04/2022 - Review Complete 03/04/2022  Allergen Reaction Noted   Pravastatin Other (See Comments) 05/25/2015    Family History  Problem Relation Age of Onset   Cancer Father        colon   Colon cancer Father 30       expired at 48 as well   Hyperlipidemia Mother     Social History   Socioeconomic History   Marital status: Single    Spouse name: Not on file   Number of children: Not on file   Years of education: Not on file   Highest education level: Not on file   Occupational History   Not on file  Tobacco Use   Smoking status: Former    Types: Cigarettes    Quit date: 10/07/1996    Years since quitting: 25.5   Smokeless tobacco: Never  Vaping Use   Vaping Use: Never used  Substance and Sexual Activity   Alcohol use: Yes    Alcohol/week: 12.0 standard drinks of alcohol    Types: 12 Cans of beer per week   Drug use: No   Sexual activity: Yes  Other Topics Concern   Not on file  Social History Narrative   Not on file   Social Determinants of Health   Financial Resource Strain: Not on file  Food Insecurity: Not on file  Transportation Needs: Not on file  Physical Activity: Not on file  Stress: Not on file  Social Connections: Not on file  Intimate Partner Violence: Not on file    Review of Systems: See HPI, otherwise negative ROS  Physical Exam: There were no vitals taken for this visit. General:   Alert,  pleasant and cooperative in NAD Head:  Normocephalic and atraumatic. Neck:  Supple; no masses or thyromegaly. Lungs:  Clear throughout to auscultation, normal respiratory effort.    Heart:  +S1, +S2, Regular rate and rhythm, No edema. Abdomen:  Soft, nontender and nondistended. Normal bowel  sounds, without guarding, and without rebound.   Neurologic:  Alert and  oriented x4;  grossly normal neurologically.  Impression/Plan: George Chang is here for an colonoscopy to be performed for surveillance due to prior history of colon polyps   Risks, benefits, limitations, and alternatives regarding  colonoscopy have been reviewed with the patient.  Questions have been answered.  All parties agreeable.   [name], MD  04/02/2022, 8:07 AM

## 2022-04-02 NOTE — Op Note (Signed)
[facility]astroenterology Patient Name: George Chang Procedure Date: 04/02/2022 9:01 AM MRN: [id number] Account #: [id number] Date of Birth: [birth year] Admit Type: Outpatient Age: 52 Room: [facility] ENDO ROOM 3 Gender: Male Note Status: Finalized Instrument Name: Jasper Riling [id number] Procedure:             Colonoscopy Indications:           Surveillance: Personal history of adenomatous polyps                         on last colonoscopy 5 years ago Providers:             [name] MD, MD Referring MD:          Valerie Roys (Referring MD) Medicines:             Monitored Anesthesia Care Complications:         No immediate complications. Procedure:             Pre-Anesthesia Assessment:                        - Prior to the procedure, a History and Physical was                         performed, and patient medications, allergies and                         sensitivities were reviewed. The patient's tolerance                         of previous anesthesia was reviewed.                        - The risks and benefits of the procedure and the                         sedation options and risks were discussed with the                         patient. All questions were answered and informed                         consent was obtained.                        - ASA Grade Assessment: II - A patient with mild                         systemic disease.                        After obtaining informed consent, the colonoscope was                         passed under direct vision. Throughout the procedure,                         the patient's blood pressure, pulse, and oxygen                         saturations  were monitored continuously. The                         Colonoscope was introduced through the anus and                         advanced to the the cecum, identified by the                         appendiceal orifice. The colonoscopy was performed                          with ease. The patient tolerated the procedure well.                         The quality of the bowel preparation was good. The                         ileocecal valve, appendiceal orifice, and rectum were                         photographed. Findings:      The perianal and digital rectal examinations were normal.      An 8 mm polyp was found in the proximal ascending colon. The polyp was       sessile. The polyp was removed with a cold snare. Resection and       retrieval were complete.      The exam was otherwise without abnormality on direct and retroflexion       views. Impression:            - One 8 mm polyp in the proximal ascending colon,                         removed with a cold snare. Resected and retrieved.                        - The examination was otherwise normal on direct and                         retroflexion views. Recommendation:        - Discharge patient to home (with escort).                        - Resume previous diet.                        - Continue present medications.                        - Await pathology results.                        - Repeat colonoscopy in 5 years for surveillance. Procedure Code(s):     --- Professional ---                        [id number], Colonoscopy, flexible; with removal of  tumor(s), polyp(s), or other lesion(s) by snare                         technique Diagnosis Code(s):     --- Professional ---                        Z86.010, Personal history of colonic polyps                        D12.2, Benign neoplasm of ascending colon CPT copyright 2022 American Medical Association. All rights reserved. The codes documented in this report are preliminary and upon coder review may  be revised to meet current compliance requirements. [name], MD [name] MD, MD 04/02/2022 9:27:08 AM This report has been signed electronically. Number of Addenda: 0 Note Initiated On: 04/02/2022 9:01 AM Scope  Withdrawal Time: 0 hours 11 minutes 3 seconds  Total Procedure Duration: 0 hours 13 minutes 6 seconds  Estimated Blood Loss:  Estimated blood loss: none.      [facility]

## 2022-04-03 ENCOUNTER — Encounter: Payer: Self-pay | Admitting: Internal Medicine

## 2022-04-03 ENCOUNTER — Encounter: Payer: Self-pay | Admitting: Gastroenterology

## 2022-04-03 LAB — SURGICAL PATHOLOGY

## 2022-09-03 ENCOUNTER — Encounter: Payer: Self-pay | Admitting: Family Medicine

## 2022-09-03 ENCOUNTER — Ambulatory Visit: Payer: 59 | Admitting: Family Medicine

## 2022-09-03 VITALS — BP 128/78 | HR 64 | Temp 97.7°F | Wt 230.8 lb

## 2022-09-03 DIAGNOSIS — M25532 Pain in left wrist: Secondary | ICD-10-CM | POA: Diagnosis not present

## 2022-09-03 DIAGNOSIS — E785 Hyperlipidemia, unspecified: Secondary | ICD-10-CM

## 2022-09-03 DIAGNOSIS — I10 Essential (primary) hypertension: Secondary | ICD-10-CM | POA: Diagnosis not present

## 2022-09-03 MED ORDER — LISINOPRIL 20 MG PO TABS: 20.0000 mg | ORAL_TABLET | Freq: Every day | ORAL | 1 refills | Status: DC

## 2022-09-03 MED ORDER — ATORVASTATIN CALCIUM 10 MG PO TABS: 10.0000 mg | ORAL_TABLET | Freq: Every day | ORAL | 1 refills | Status: DC

## 2022-09-03 NOTE — Progress Notes (Signed)
BP 128/78   Pulse 64   Temp 97.7 F (36.5 C) (Oral)   Wt 230 lb 12.8 oz (104.7 kg)   SpO2 96%   BMI 31.30 kg/m    Subjective:    Patient ID: George Chang, male    DOB: 1970-11-22, 52 y.o.   MRN: [id number]  HPI: George Chang is a 52 y.o. male  Chief Complaint  Patient presents with   Hypertension   Hyperlipidemia   HYPERTENSION / HYPERLIPIDEMIA Satisfied with current treatment? yes Duration of hypertension: chronic BP monitoring frequency: not checking BP medication side effects: no Past BP meds: lisinopril Duration of hyperlipidemia: chronic Cholesterol medication side effects: unsure Cholesterol supplements: none Past cholesterol medications: atorvastatin Medication compliance: excellent compliance Aspirin: no Recent stressors: no Recurrent headaches: no Visual changes: no Palpitations: no Dyspnea: no Chest pain: no Lower extremity edema: no Dizzy/lightheaded: no  Wrist has been hurting with certain positions. 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 He notes its about 25% of the time. It's in his radial side of his wrist- no swelling, no redness. No other concerns or complaints at this time.   Relevant past medical, surgical, family and social history reviewed and updated as indicated. Interim medical history since our last visit reviewed. Allergies and medications reviewed and updated.  Review of Systems  Constitutional: Negative.   HENT: Negative.    Respiratory: Negative.    Cardiovascular: Negative.   Musculoskeletal:  Positive for arthralgias. Negative for back pain, gait problem, joint swelling, myalgias, neck pain and neck stiffness.  Skin: Negative.   Psychiatric/Behavioral: Negative.      Per HPI unless specifically indicated above     Objective:    BP 128/78   Pulse 64   Temp 97.7 F (36.5 C) (Oral)   Wt 230 lb 12.8 oz (104.7 kg)   SpO2 96%   BMI 31.30 kg/m   Wt Readings from Last 3 Encounters:  09/03/22 230 lb 12.8 oz  (104.7 kg)  04/02/22 221 lb 6.4 oz (100.4 kg)  03/04/22 229 lb 14.4 oz (104.3 kg)    Physical Exam Vitals and nursing note reviewed.  Constitutional:      General: He is not in acute distress.    Appearance: Normal appearance. He is not ill-appearing, toxic-appearing or diaphoretic.  HENT:     Head: Normocephalic and atraumatic.     Right Ear: External ear normal.     Left Ear: External ear normal.     Nose: Nose normal.     Mouth/Throat:     Mouth: Mucous membranes are moist.     Pharynx: Oropharynx is clear.  Eyes:     General: No scleral icterus.       Right eye: No discharge.        Left eye: No discharge.     Extraocular Movements: Extraocular movements intact.     Conjunctiva/sclera: Conjunctivae normal.     Pupils: Pupils are equal, round, and reactive to light.  Cardiovascular:     Rate and Rhythm: Normal rate and regular rhythm.     Pulses: Normal pulses.     Heart sounds: Normal heart sounds. No murmur heard.    No friction rub. No gallop.  Pulmonary:     Effort: Pulmonary effort is normal. No respiratory distress.     Breath sounds: Normal breath sounds. No stridor. No wheezing, rhonchi or rales.  Chest:     Chest wall: No tenderness.  Musculoskeletal:  General: Normal range of motion.     Cervical back: Normal range of motion and neck supple.  Skin:    General: Skin is warm and dry.     Capillary Refill: Capillary refill takes less than 2 seconds.     Coloration: Skin is not jaundiced or pale.     Findings: No bruising, erythema, lesion or rash.  Neurological:     General: No focal deficit present.     Mental Status: He is alert and oriented to person, place, and time. Mental status is at baseline.  Psychiatric:        Mood and Affect: Mood normal.        Behavior: Behavior normal.        Thought Content: Thought content normal.        Judgment: Judgment normal.     Results for orders placed or performed during the hospital encounter of 04/02/22   Surgical pathology  Result Value Ref Range   SURGICAL PATHOLOGY      SURGICAL PATHOLOGY CASE: [id number] PATIENT: Ying Borghi Surgical Pathology Report     Specimen Submitted: A. Colon polyp, ascending; cold snare  Clinical History: Screening colonoscopy Z12.11.  Polyp      DIAGNOSIS: A.  COLON POLYP, ASCENDING; COLD SNARE: - SESSILE SERRATED POLYP. - NEGATIVE FOR DYSPLASIA AND MALIGNANCY.  GROSS DESCRIPTION: A. Labeled: Cold snare ascending colon polyp Received: Formalin Collection time: 9:16 AM on 04/02/2022 Placed into formalin time: 9:16 AM on 04/02/2022 Tissue fragment(s): Multiple Size: Aggregate, 0.9 x 0.6 x 0.1 cm Description: Tan soft tissue fragments Entirely submitted in 1 cassette.  CM 04/02/2022  Final Diagnosis performed by Elijah Birk, MD.   Electronically signed 04/03/2022 10:05:49AM The electronic signature indicates that the named Attending Pathologist has evaluated the specimen Technical component performed at [facility], [address] [place], [place] [address] Lab: [phone] Dir: Estill Batten a, MD, MMM  Professional component performed at [facility], [facility], [address] [place], [place], [place] [address] Lab: [phone] Dir: Beryle Quant, MD       Assessment & Plan:   Problem List Items Addressed This Visit       Cardiovascular and Mediastinum   Hypertension    Under good control on current regimen. Continue current regimen. Continue to monitor. Call with any concerns. Refills given. Labs drawn today.        Relevant Medications   atorvastatin (LIPITOR) 10 MG tablet   lisinopril (ZESTRIL) 20 MG tablet   Other Relevant Orders   Comprehensive metabolic panel     Other   Hyperlipidemia - Primary    Concerned that his atorvastatin is causing back pain. Will hold for 2 weeks and see how he's feeling. If normal, will continue current regimen. Call with any concerns. Refills given. Labs drawn today.       Relevant Medications   atorvastatin (LIPITOR) 10 MG tablet   lisinopril (ZESTRIL) 20 MG tablet   Other Relevant Orders   Comprehensive metabolic panel   Lipid Panel w/o Chol/HDL Ratio   Other Visit Diagnoses     Left wrist pain       Referral to ortho placed today. Call with any concerns.   Relevant Orders   Ambulatory referral to Orthopedic Surgery        Follow up plan: Return in about 6 months (around 03/06/2023) for physical.

## 2022-09-03 NOTE — Assessment & Plan Note (Signed)
Under good control on current regimen. Continue current regimen. Continue to monitor. Call with any concerns. Refills given. Labs drawn today.   

## 2022-09-03 NOTE — Assessment & Plan Note (Signed)
Concerned that his atorvastatin is causing back pain. Will hold for 2 weeks and see how he's feeling. If normal, will continue current regimen. Call with any concerns. Refills given. Labs drawn today.

## 2022-09-06 ENCOUNTER — Encounter: Payer: Self-pay | Admitting: Family Medicine

## 2022-09-11 DIAGNOSIS — M25532 Pain in left wrist: Secondary | ICD-10-CM | POA: Diagnosis not present

## 2022-09-18 ENCOUNTER — Telehealth: Payer: Self-pay | Admitting: Family Medicine

## 2022-09-18 NOTE — Telephone Encounter (Signed)
-----   Message from [facility] sent at 09/03/2022  8:34 AM EDT ----- Check in on how his back is feeling off atorvastatin.

## 2022-09-18 NOTE — Telephone Encounter (Signed)
Can we please check in on how his back is feeing off atorvastatin?

## 2022-09-19 NOTE — Telephone Encounter (Signed)
Called and spoke with pt he stated that he doesn't see much of a difference and is going to go ahead and start it back up

## 2022-09-19 NOTE — Telephone Encounter (Signed)
Perfect. Thanks.

## 2022-10-14 DIAGNOSIS — J209 Acute bronchitis, unspecified: Secondary | ICD-10-CM | POA: Diagnosis not present

## 2022-10-14 DIAGNOSIS — J069 Acute upper respiratory infection, unspecified: Secondary | ICD-10-CM | POA: Diagnosis not present

## 2022-10-14 DIAGNOSIS — Z03818 Encounter for observation for suspected exposure to other biological agents ruled out: Secondary | ICD-10-CM | POA: Diagnosis not present

## 2023-03-10 ENCOUNTER — Encounter: Payer: Self-pay | Admitting: Family Medicine

## 2023-03-10 ENCOUNTER — Ambulatory Visit (INDEPENDENT_AMBULATORY_CARE_PROVIDER_SITE_OTHER): Payer: 59 | Admitting: Family Medicine

## 2023-03-10 VITALS — BP 115/71 | HR 55 | Temp 98.0°F | Ht 71.0 in | Wt 231.4 lb

## 2023-03-10 DIAGNOSIS — Z0000 Encounter for general adult medical examination without abnormal findings: Secondary | ICD-10-CM

## 2023-03-10 DIAGNOSIS — E785 Hyperlipidemia, unspecified: Secondary | ICD-10-CM

## 2023-03-10 DIAGNOSIS — Z23 Encounter for immunization: Secondary | ICD-10-CM

## 2023-03-10 DIAGNOSIS — I10 Essential (primary) hypertension: Secondary | ICD-10-CM

## 2023-03-10 MED ORDER — LISINOPRIL 20 MG PO TABS: 20.0000 mg | ORAL_TABLET | Freq: Every day | ORAL | 1 refills | Status: DC

## 2023-03-10 NOTE — Progress Notes (Signed)
 BP 115/71   Pulse (!) 55   Temp 98 F (36.7 C) (Oral)   Ht 5' 11 (1.803 m)   Wt 231 lb 6.4 oz (105 kg)   SpO2 94%   BMI 32.27 kg/m    Subjective:    Patient ID: George Chang, male    DOB: 1970-04-22, 53 y.o.   MRN: [id number]  HPI: George Chang is a 53 y.o. male presenting on 03/10/2023 for comprehensive medical examination. Current medical complaints include:  HYPERTENSION / HYPERLIPIDEMIA- has not been taking any of his medicine for about 2 weeks, but started it again last night.  Satisfied with current treatment? yes Duration of hypertension: chronic BP monitoring frequency: rarely BP medication side effects: no Past BP meds: lisinopril  Duration of hyperlipidemia: chronic Cholesterol medication side effects: no Cholesterol supplements: none Past cholesterol medications: atorvastatin  Medication compliance: excellent compliance Aspirin: no Recent stressors: no Recurrent headaches: no Visual changes: no Palpitations: no Dyspnea: no Chest pain: no Lower extremity edema: no Dizzy/lightheaded: no  Interim Problems from his last visit: no  Depression Screen done today and results listed below:     03/10/2023    8:09 AM 09/03/2022    8:14 AM 03/04/2022    8:10 AM 08/28/2021    8:19 AM  Depression screen PHQ 2/9  Decreased Interest 0 0 0 1  Down, Depressed, Hopeless 0 0 0 0  PHQ - 2 Score 0 0 0 1  Altered sleeping 0 0 0 1  Tired, decreased energy 0 0 1 1  Change in appetite 0 0 0 0  Feeling bad or failure about yourself  0 0 0 0  Trouble concentrating 0 0 0 0  Moving slowly or fidgety/restless 0 0 0 0  Suicidal thoughts 0 0 0 0  PHQ-9 Score 0 0 1 3  Difficult doing work/chores Not difficult at all Not difficult at all Not difficult at all Not difficult at all    Past Medical History:  Past Medical History:  Diagnosis Date   Anal fissure 02/14/2015   Back pain    Environmental allergies    GERD (gastroesophageal reflux disease)    Personal history of  colonic polyps 02/14/2015   Plantar fasciitis     Surgical History:  Past Surgical History:  Procedure Laterality Date   COLONOSCOPY     COLONOSCOPY WITH PROPOFOL  N/A 04/02/2022   Procedure: COLONOSCOPY WITH PROPOFOL ;  Surgeon: Therisa Bi, MD;  Location: [facility] ENDOSCOPY;  Service: Gastroenterology;  Laterality: N/A;    Medications:  Current Outpatient Medications on File Prior to Visit  Medication Sig   atorvastatin  (LIPITOR) 10 MG tablet Take 1 tablet (10 mg total) by mouth daily.   calcium  carbonate (TUMS - DOSED IN MG ELEMENTAL CALCIUM ) 500 MG chewable tablet Chew 1 tablet by mouth daily. Reported on 02/07/2015   cyclobenzaprine  (FLEXERIL ) 10 MG tablet Take 1 tablet (10 mg total) by mouth at bedtime as needed for muscle spasms. (Patient not taking: Reported on 03/10/2023)   No current facility-administered medications on file prior to visit.    Allergies:  Allergies  Allergen Reactions   Pravastatin  Other (See Comments)    Hard stools    Social History:  Social History   Socioeconomic History   Marital status: Single    Spouse name: Not on file   Number of children: Not on file   Years of education: Not on file   Highest education level: Not on file  Occupational History   Not on  file  Tobacco Use   Smoking status: Former    Current packs/day: 0.00    Types: Cigarettes    Quit date: 10/07/1996    Years since quitting: 26.4   Smokeless tobacco: Never  Vaping Use   Vaping status: Never Used  Substance and Sexual Activity   Alcohol use: Yes    Alcohol/week: 12.0 standard drinks of alcohol    Types: 12 Cans of beer per week   Drug use: No   Sexual activity: Yes  Other Topics Concern   Not on file  Social History Narrative   Not on file   Social Drivers of Health   Financial Resource Strain: Not on file  Food Insecurity: Not on file  Transportation Needs: Not on file  Physical Activity: Not on file  Stress: Not on file  Social Connections: Not on file   Intimate Partner Violence: Not on file   Social History   Tobacco Use  Smoking Status Former   Current packs/day: 0.00   Types: Cigarettes   Quit date: 10/07/1996   Years since quitting: 26.4  Smokeless Tobacco Never   Social History   Substance and Sexual Activity  Alcohol Use Yes   Alcohol/week: 12.0 standard drinks of alcohol   Types: 12 Cans of beer per week    Family History:  Family History  Problem Relation Age of Onset   Cancer Father        colon   Colon cancer Father 86       expired at 80 as well   Hyperlipidemia Mother     Past medical history, surgical history, medications, allergies, family history and social history reviewed with patient today and changes made to appropriate areas of the chart.   Review of Systems  Constitutional: Negative.   HENT:  Positive for congestion. Negative for ear discharge, ear pain, hearing loss, nosebleeds, sinus pain, sore throat and tinnitus.   Eyes: Negative.   Respiratory: Negative.  Negative for stridor.   Cardiovascular: Negative.   Gastrointestinal:  Positive for diarrhea (with food choices) and heartburn (with food choices). Negative for abdominal pain, blood in stool, constipation, melena, nausea and vomiting.  Genitourinary: Negative.   Musculoskeletal: Negative.   Skin: Negative.   Neurological: Negative.   Endo/Heme/Allergies: Negative.   Psychiatric/Behavioral: Negative.     All other ROS negative except what is listed above and in the HPI.      Objective:    BP 115/71   Pulse (!) 55   Temp 98 F (36.7 C) (Oral)   Ht 5' 11 (1.803 m)   Wt 231 lb 6.4 oz (105 kg)   SpO2 94%   BMI 32.27 kg/m   Wt Readings from Last 3 Encounters:  03/10/23 231 lb 6.4 oz (105 kg)  09/03/22 230 lb 12.8 oz (104.7 kg)  04/02/22 221 lb 6.4 oz (100.4 kg)    Physical Exam Vitals and nursing note reviewed.  Constitutional:      General: He is not in acute distress.    Appearance: Normal appearance. He is obese. He is  not ill-appearing, toxic-appearing or diaphoretic.  HENT:     Head: Normocephalic and atraumatic.     Right Ear: Tympanic membrane, ear canal and external ear normal. There is no impacted cerumen.     Left Ear: Tympanic membrane, ear canal and external ear normal. There is no impacted cerumen.     Nose: Nose normal. No congestion or rhinorrhea.     Mouth/Throat:  Mouth: Mucous membranes are moist.     Pharynx: Oropharynx is clear. No oropharyngeal exudate or posterior oropharyngeal erythema.  Eyes:     General: No scleral icterus.       Right eye: No discharge.        Left eye: No discharge.     Extraocular Movements: Extraocular movements intact.     Conjunctiva/sclera: Conjunctivae normal.     Pupils: Pupils are equal, round, and reactive to light.  Neck:     Vascular: No carotid bruit.  Cardiovascular:     Rate and Rhythm: Normal rate and regular rhythm.     Pulses: Normal pulses.     Heart sounds: No murmur heard.    No friction rub. No gallop.  Pulmonary:     Effort: Pulmonary effort is normal. No respiratory distress.     Breath sounds: Normal breath sounds. No stridor. No wheezing, rhonchi or rales.  Chest:     Chest wall: No tenderness.  Abdominal:     General: Abdomen is flat. Bowel sounds are normal. There is no distension.     Palpations: Abdomen is soft. There is no mass.     Tenderness: There is no abdominal tenderness. There is no right CVA tenderness, left CVA tenderness, guarding or rebound.     Hernia: No hernia is present.  Genitourinary:    Comments: Genital exam deferred with shared decision making Musculoskeletal:        General: No swelling, tenderness, deformity or signs of injury.     Cervical back: Normal range of motion and neck supple. No rigidity. No muscular tenderness.     Right lower leg: No edema.     Left lower leg: No edema.  Lymphadenopathy:     Cervical: No cervical adenopathy.  Skin:    General: Skin is warm and dry.     Capillary  Refill: Capillary refill takes less than 2 seconds.     Coloration: Skin is not jaundiced or pale.     Findings: No bruising, erythema, lesion or rash.  Neurological:     General: No focal deficit present.     Mental Status: He is alert and oriented to person, place, and time.     Cranial Nerves: No cranial nerve deficit.     Sensory: No sensory deficit.     Motor: No weakness.     Coordination: Coordination normal.     Gait: Gait normal.     Deep Tendon Reflexes: Reflexes normal.  Psychiatric:        Mood and Affect: Mood normal.        Behavior: Behavior normal.        Thought Content: Thought content normal.        Judgment: Judgment normal.     Results for orders placed or performed in visit on 09/03/22  Comprehensive metabolic panel   Collection Time: 09/03/22  8:25 AM  Result Value Ref Range   Glucose 107 (H) 70 - 99 mg/dL   BUN 16 6 - 24 mg/dL   Creatinine, Ser 8.79 0.76 - 1.27 mg/dL   eGFR 73 >40 fO/fpw/8.26   BUN/Creatinine Ratio 13 9 - 20   Sodium 141 134 - 144 mmol/L   Potassium 4.6 3.5 - 5.2 mmol/L   Chloride 104 96 - 106 mmol/L   CO2 21 20 - 29 mmol/L   Calcium  9.5 8.7 - 10.2 mg/dL   Total Protein 7.5 6.0 - 8.5 g/dL   Albumin 4.7 3.8 - 4.9  g/dL   Globulin, Total 2.8 1.5 - 4.5 g/dL   Bilirubin Total 0.4 0.0 - 1.2 mg/dL   Alkaline Phosphatase 78 44 - 121 IU/L   AST 31 0 - 40 IU/L   ALT 59 (H) 0 - 44 IU/L  Lipid Panel w/o Chol/HDL Ratio   Collection Time: 09/03/22  8:25 AM  Result Value Ref Range   Cholesterol, Total 213 (H) 100 - 199 mg/dL   Triglycerides 711 (H) 0 - 149 mg/dL   HDL 36 (L) >60 mg/dL   VLDL Cholesterol Cal 51 (H) 5 - 40 mg/dL   LDL Chol Calc (NIH) 873 (H) 0 - 99 mg/dL      Assessment & Plan:   Problem List Items Addressed This Visit       Cardiovascular and Mediastinum   Hypertension   Under good control on current regimen. Continue current regimen. Continue to monitor. Call with any concerns. Refills given. Labs drawn today.         Relevant Medications   lisinopril  (ZESTRIL ) 20 MG tablet   Other Relevant Orders   Urine Microalbumin w/creat. ratio     Other   Hyperlipidemia   Has been holding his cholesterol medicine. Will recheck levels and adjust medicine as needed. Call with any concerns. Continue to monitor.       Relevant Medications   lisinopril  (ZESTRIL ) 20 MG tablet   Other Visit Diagnoses       Routine general medical examination at a health care facility    -  Primary   Vaccines up to date/declined. Screening labs checked today. Colonoscopy up to date. Continue diet and exercise. Call with any concerns.   Relevant Orders   CBC with Differential/Platelet   Comp Met (CMET)   TSH   Lipid Profile   PSA        LABORATORY TESTING:  Health maintenance labs ordered today as discussed above.   The natural history of prostate cancer and ongoing controversy regarding screening and potential treatment outcomes of prostate cancer has been discussed with the patient. The meaning of a false positive PSA and a false negative PSA has been discussed. He indicates understanding of the limitations of this screening test and wishes to proceed with screening PSA testing.   IMMUNIZATIONS:   - Tdap: Tetanus vaccination status reviewed: last tetanus booster within 10 years. - Influenza: Refused - Pneumovax: Not applicable - Prevnar: Not applicable - COVID: Refused - HPV: Not applicable - Shingrix  vaccine: Administered today  SCREENING: - Colonoscopy: Up to date  Discussed with patient purpose of the colonoscopy is to detect colon cancer at curable precancerous or early stages   PATIENT COUNSELING:    Sexuality: Discussed sexually transmitted diseases, partner selection, use of condoms, avoidance of unintended pregnancy  and contraceptive alternatives.   Advised to avoid cigarette smoking.  I discussed with the patient that most people either abstain from alcohol or drink within safe limits (<=14/week  and <=4 drinks/occasion for males, <=7/weeks and <= 3 drinks/occasion for females) and that the risk for alcohol disorders and other health effects rises proportionally with the number of drinks per week and how often a drinker exceeds daily limits.  Discussed cessation/primary prevention of drug use and availability of treatment for abuse.   Diet: Encouraged to adjust caloric intake to maintain  or achieve ideal body weight, to reduce intake of dietary saturated fat and total fat, to limit sodium intake by avoiding high sodium foods and not adding table salt, and to  maintain adequate dietary potassium and calcium  preferably from fresh fruits, vegetables, and low-fat dairy products.    stressed the importance of regular exercise  Injury prevention: Discussed safety belts, safety helmets, smoke detector, smoking near bedding or upholstery.   Dental health: Discussed importance of regular tooth brushing, flossing, and dental visits.   Follow up plan: NEXT PREVENTATIVE PHYSICAL DUE IN 1 YEAR. Return in about 6 months (around 09/07/2023).

## 2023-03-10 NOTE — Assessment & Plan Note (Signed)
 Under good control on current regimen. Continue current regimen. Continue to monitor. Call with any concerns. Refills given. Labs drawn today.

## 2023-03-10 NOTE — Assessment & Plan Note (Signed)
Has been holding his cholesterol medicine. Will recheck levels and adjust medicine as needed. Call with any concerns. Continue to monitor.

## 2023-03-11 LAB — LIPID PANEL
Chol/HDL Ratio: 6.3 {ratio} — ABNORMAL HIGH (ref 0.0–5.0)
Cholesterol, Total: 240 mg/dL — ABNORMAL HIGH (ref 100–199)
HDL: 38 mg/dL — ABNORMAL LOW (ref >39)
LDL Chol Calc (NIH): 148 mg/dL — ABNORMAL HIGH (ref 0–99)
Triglycerides: 295 mg/dL — ABNORMAL HIGH (ref 0–149)
VLDL Cholesterol Cal: 54 mg/dL — ABNORMAL HIGH (ref 5–40)

## 2023-03-11 LAB — CBC WITH DIFFERENTIAL/PLATELET
Basophils Absolute: 0 10*3/uL (ref 0.0–0.2)
Basos: 0 %
EOS (ABSOLUTE): 0.1 10*3/uL (ref 0.0–0.4)
Eos: 1 %
Hematocrit: 46.8 % (ref 37.5–51.0)
Hemoglobin: 15.8 g/dL (ref 13.0–17.7)
Immature Grans (Abs): 0 10*3/uL (ref 0.0–0.1)
Immature Granulocytes: 0 %
Lymphocytes Absolute: 1.6 10*3/uL (ref 0.7–3.1)
Lymphs: 31 %
MCH: 30 pg (ref 26.6–33.0)
MCHC: 33.8 g/dL (ref 31.5–35.7)
MCV: 89 fL (ref 79–97)
Monocytes Absolute: 0.5 10*3/uL (ref 0.1–0.9)
Monocytes: 10 %
Neutrophils Absolute: 3 10*3/uL (ref 1.4–7.0)
Neutrophils: 58 %
Platelets: 178 10*3/uL (ref 150–450)
RBC: 5.27 x10E6/uL (ref 4.14–5.80)
RDW: 13 % (ref 11.6–15.4)
WBC: 5.1 10*3/uL (ref 3.4–10.8)

## 2023-03-11 LAB — PSA: Prostate Specific Ag, Serum: 0.6 ng/mL (ref 0.0–4.0)

## 2023-03-11 LAB — COMPREHENSIVE METABOLIC PANEL
ALT: 47 [IU]/L — ABNORMAL HIGH (ref 0–44)
AST: 28 [IU]/L (ref 0–40)
Albumin: 4.8 g/dL (ref 3.8–4.9)
Alkaline Phosphatase: 79 [IU]/L (ref 44–121)
BUN/Creatinine Ratio: 10 (ref 9–20)
BUN: 13 mg/dL (ref 6–24)
Bilirubin Total: 0.4 mg/dL (ref 0.0–1.2)
CO2: 23 mmol/L (ref 20–29)
Calcium: 9.5 mg/dL (ref 8.7–10.2)
Chloride: 103 mmol/L (ref 96–106)
Creatinine, Ser: 1.25 mg/dL (ref 0.76–1.27)
Globulin, Total: 2.3 g/dL (ref 1.5–4.5)
Glucose: 112 mg/dL — ABNORMAL HIGH (ref 70–99)
Potassium: 4.5 mmol/L (ref 3.5–5.2)
Sodium: 141 mmol/L (ref 134–144)
Total Protein: 7.1 g/dL (ref 6.0–8.5)
eGFR: 69 mL/min/{1.73_m2} (ref >59)

## 2023-03-11 LAB — TSH: TSH: 3.82 u[IU]/mL (ref 0.450–4.500)

## 2023-03-11 MED ORDER — ATORVASTATIN CALCIUM 10 MG PO TABS: 10.0000 mg | ORAL_TABLET | Freq: Every day | ORAL | 1 refills | Status: DC

## 2023-03-11 NOTE — Addendum Note (Signed)
 Addended by: Solomon Dupre on: 03/11/2023 01:13 PM   Modules accepted: Orders

## 2023-03-12 LAB — MICROALBUMIN / CREATININE URINE RATIO
Creatinine, Urine: 163.2 mg/dL
Microalb/Creat Ratio: 9 mg/g{creat} (ref 0–29)
Microalbumin, Urine: 15 ug/mL

## 2023-06-08 ENCOUNTER — Ambulatory Visit: Payer: 59

## 2023-09-14 ENCOUNTER — Ambulatory Visit: Payer: 59 | Admitting: Family Medicine

## 2023-10-02 ENCOUNTER — Encounter: Payer: Self-pay | Admitting: Family Medicine

## 2023-10-02 ENCOUNTER — Ambulatory Visit: Admitting: Family Medicine

## 2023-10-02 VITALS — BP 116/70 | HR 67 | Temp 97.5°F | Ht 71.0 in | Wt 229.4 lb

## 2023-10-02 DIAGNOSIS — Z23 Encounter for immunization: Secondary | ICD-10-CM

## 2023-10-02 DIAGNOSIS — I10 Essential (primary) hypertension: Secondary | ICD-10-CM | POA: Diagnosis not present

## 2023-10-02 DIAGNOSIS — Z789 Other specified health status: Secondary | ICD-10-CM | POA: Diagnosis not present

## 2023-10-02 DIAGNOSIS — E785 Hyperlipidemia, unspecified: Secondary | ICD-10-CM

## 2023-10-02 MED ORDER — LISINOPRIL 20 MG PO TABS: 20.0000 mg | ORAL_TABLET | Freq: Every day | ORAL | 1 refills | Status: AC

## 2023-10-02 MED ORDER — ATORVASTATIN CALCIUM 10 MG PO TABS: 10.0000 mg | ORAL_TABLET | Freq: Every day | ORAL | 1 refills | Status: AC

## 2023-10-02 NOTE — Assessment & Plan Note (Signed)
 Under good control on current regimen. Continue current regimen. Continue to monitor. Call with any concerns. Refills given. Labs drawn today.

## 2023-10-02 NOTE — Progress Notes (Signed)
 BP 116/70   Pulse 67   Temp (!) 97.5 F (36.4 C) (Oral)   Ht 5' 11 (1.803 m)   Wt 229 lb 6.4 oz (104.1 kg)   SpO2 96%   BMI 31.99 kg/m    Subjective:    Patient ID: George Chang, male    DOB: 13-May-1970, 53 y.o.   MRN: [id number]  HPI: George Chang is a 53 y.o. male  Chief Complaint  Patient presents with   Hypertension   Hyperlipidemia   HYPERTENSION / HYPERLIPIDEMIA Satisfied with current treatment? yes Duration of hypertension: chronic BP monitoring frequency: not checking BP medication side effects: no Past BP meds: lisinopril  Duration of hyperlipidemia: chronic Cholesterol medication side effects: no Cholesterol supplements: none Past cholesterol medications: atorvastatin  Medication compliance: excellent compliance Aspirin: no Recent stressors: no Recurrent headaches: no Visual changes: no Palpitations: no Dyspnea: no Chest pain: no Lower extremity edema: no Dizzy/lightheaded: no  Relevant past medical, surgical, family and social history reviewed and updated as indicated. Interim medical history since our last visit reviewed. Allergies and medications reviewed and updated.  Review of Systems  Constitutional: Negative.   Respiratory: Negative.    Cardiovascular: Negative.   Gastrointestinal: Negative.   Musculoskeletal: Negative.   Psychiatric/Behavioral: Negative.      Per HPI unless specifically indicated above     Objective:    BP 116/70   Pulse 67   Temp (!) 97.5 F (36.4 C) (Oral)   Ht 5' 11 (1.803 m)   Wt 229 lb 6.4 oz (104.1 kg)   SpO2 96%   BMI 31.99 kg/m   Wt Readings from Last 3 Encounters:  10/02/23 229 lb 6.4 oz (104.1 kg)  03/10/23 231 lb 6.4 oz (105 kg)  09/03/22 230 lb 12.8 oz (104.7 kg)    Physical Exam Vitals and nursing note reviewed.  Constitutional:      General: He is not in acute distress.    Appearance: Normal appearance. He is not ill-appearing, toxic-appearing or diaphoretic.  HENT:     Head:  Normocephalic and atraumatic.     Right Ear: External ear normal.     Left Ear: External ear normal.     Nose: Nose normal.     Mouth/Throat:     Mouth: Mucous membranes are moist.     Pharynx: Oropharynx is clear.  Eyes:     General: No scleral icterus.       Right eye: No discharge.        Left eye: No discharge.     Extraocular Movements: Extraocular movements intact.     Conjunctiva/sclera: Conjunctivae normal.     Pupils: Pupils are equal, round, and reactive to light.  Cardiovascular:     Rate and Rhythm: Normal rate and regular rhythm.     Pulses: Normal pulses.     Heart sounds: Normal heart sounds. No murmur heard.    No friction rub. No gallop.  Pulmonary:     Effort: Pulmonary effort is normal. No respiratory distress.     Breath sounds: Normal breath sounds. No stridor. No wheezing, rhonchi or rales.  Chest:     Chest wall: No tenderness.  Musculoskeletal:        General: Normal range of motion.     Cervical back: Normal range of motion and neck supple.  Skin:    General: Skin is warm and dry.     Capillary Refill: Capillary refill takes less than 2 seconds.     Coloration: Skin  is not jaundiced or pale.     Findings: No bruising, erythema, lesion or rash.  Neurological:     General: No focal deficit present.     Mental Status: He is alert and oriented to person, place, and time. Mental status is at baseline.  Psychiatric:        Mood and Affect: Mood normal.        Behavior: Behavior normal.        Thought Content: Thought content normal.        Judgment: Judgment normal.     Results for orders placed or performed in visit on 03/10/23  Urine Microalbumin w/creat. ratio   Collection Time: 03/10/23  8:17 AM  Result Value Ref Range   Creatinine, Urine 163.2 Not Estab. mg/dL   Microalbumin, Urine 84.9 Not Estab. ug/mL   Microalb/Creat Ratio 9 0 - 29 mg/g creat  CBC with Differential/Platelet   Collection Time: 03/10/23  8:18 AM  Result Value Ref Range    WBC 5.1 3.4 - 10.8 x10E3/uL   RBC 5.27 4.14 - 5.80 x10E6/uL   Hemoglobin 15.8 13.0 - 17.7 g/dL   Hematocrit 53.1 62.4 - 51.0 %   MCV 89 79 - 97 fL   MCH 30.0 26.6 - 33.0 pg   MCHC 33.8 31.5 - 35.7 g/dL   RDW 86.9 88.3 - 84.5 %   Platelets 178 150 - 450 x10E3/uL   Neutrophils 58 Not Estab. %   Lymphs 31 Not Estab. %   Monocytes 10 Not Estab. %   Eos 1 Not Estab. %   Basos 0 Not Estab. %   Neutrophils Absolute 3.0 1.4 - 7.0 x10E3/uL   Lymphocytes Absolute 1.6 0.7 - 3.1 x10E3/uL   Monocytes Absolute 0.5 0.1 - 0.9 x10E3/uL   EOS (ABSOLUTE) 0.1 0.0 - 0.4 x10E3/uL   Basophils Absolute 0.0 0.0 - 0.2 x10E3/uL   Immature Granulocytes 0 Not Estab. %   Immature Grans (Abs) 0.0 0.0 - 0.1 x10E3/uL  Comp Met (CMET)   Collection Time: 03/10/23  8:18 AM  Result Value Ref Range   Glucose 112 (H) 70 - 99 mg/dL   BUN 13 6 - 24 mg/dL   Creatinine, Ser 8.74 0.76 - 1.27 mg/dL   eGFR 69 >40 fO/fpw/8.26   BUN/Creatinine Ratio 10 9 - 20   Sodium 141 134 - 144 mmol/L   Potassium 4.5 3.5 - 5.2 mmol/L   Chloride 103 96 - 106 mmol/L   CO2 23 20 - 29 mmol/L   Calcium  9.5 8.7 - 10.2 mg/dL   Total Protein 7.1 6.0 - 8.5 g/dL   Albumin 4.8 3.8 - 4.9 g/dL   Globulin, Total 2.3 1.5 - 4.5 g/dL   Bilirubin Total 0.4 0.0 - 1.2 mg/dL   Alkaline Phosphatase 79 44 - 121 IU/L   AST 28 0 - 40 IU/L   ALT 47 (H) 0 - 44 IU/L  TSH   Collection Time: 03/10/23  8:18 AM  Result Value Ref Range   TSH 3.820 0.450 - 4.500 uIU/mL  Lipid Profile   Collection Time: 03/10/23  8:18 AM  Result Value Ref Range   Cholesterol, Total 240 (H) 100 - 199 mg/dL   Triglycerides 704 (H) 0 - 149 mg/dL   HDL 38 (L) >60 mg/dL   VLDL Cholesterol Cal 54 (H) 5 - 40 mg/dL   LDL Chol Calc (NIH) 851 (H) 0 - 99 mg/dL   Chol/HDL Ratio 6.3 (H) 0.0 - 5.0 ratio  PSA  Collection Time: 03/10/23  8:18 AM  Result Value Ref Range   Prostate Specific Ag, Serum 0.6 0.0 - 4.0 ng/mL      Assessment & Plan:   Problem List Items Addressed This  Visit       Cardiovascular and Mediastinum   Hypertension - Primary   Under good control on current regimen. Continue current regimen. Continue to monitor. Call with any concerns. Refills given. Labs drawn today.       Relevant Medications   atorvastatin  (LIPITOR) 10 MG tablet   lisinopril  (ZESTRIL ) 20 MG tablet   Other Relevant Orders   CBC with Differential/Platelet   Comprehensive metabolic panel with GFR     Other   Hyperlipidemia   Under good control on current regimen. Continue current regimen. Continue to monitor. Call with any concerns. Refills given. Labs drawn today.        Relevant Medications   atorvastatin  (LIPITOR) 10 MG tablet   lisinopril  (ZESTRIL ) 20 MG tablet   Other Relevant Orders   CBC with Differential/Platelet   Comprehensive metabolic panel with GFR   Lipid Panel w/o Chol/HDL Ratio   Other Visit Diagnoses       Hepatitis B vaccination status unknown       Labs drawn today. Await results.   Relevant Orders   Hepatitis B surface antibody,quantitative        Follow up plan: Return in about 6 months (around 04/02/2024) for physical.

## 2023-10-03 LAB — CBC WITH DIFFERENTIAL/PLATELET
Basophils Absolute: 0 x10E3/uL (ref 0.0–0.2)
Basos: 1 %
EOS (ABSOLUTE): 0.2 x10E3/uL (ref 0.0–0.4)
Eos: 4 %
Hematocrit: 43.6 % (ref 37.5–51.0)
Hemoglobin: 14.5 g/dL (ref 13.0–17.7)
Immature Grans (Abs): 0 x10E3/uL (ref 0.0–0.1)
Immature Granulocytes: 0 %
Lymphocytes Absolute: 1.4 x10E3/uL (ref 0.7–3.1)
Lymphs: 29 %
MCH: 30.3 pg (ref 26.6–33.0)
MCHC: 33.3 g/dL (ref 31.5–35.7)
MCV: 91 fL (ref 79–97)
Monocytes Absolute: 0.5 x10E3/uL (ref 0.1–0.9)
Monocytes: 9 %
Neutrophils Absolute: 2.9 x10E3/uL (ref 1.4–7.0)
Neutrophils: 57 %
Platelets: 165 x10E3/uL (ref 150–450)
RBC: 4.78 x10E6/uL (ref 4.14–5.80)
RDW: 13 % (ref 11.6–15.4)
WBC: 5 x10E3/uL (ref 3.4–10.8)

## 2023-10-03 LAB — COMPREHENSIVE METABOLIC PANEL WITH GFR
ALT: 47 IU/L — ABNORMAL HIGH (ref 0–44)
AST: 24 IU/L (ref 0–40)
Albumin: 4.7 g/dL (ref 3.8–4.9)
Alkaline Phosphatase: 80 IU/L (ref 44–121)
BUN/Creatinine Ratio: 14 (ref 9–20)
BUN: 15 mg/dL (ref 6–24)
Bilirubin Total: 0.5 mg/dL (ref 0.0–1.2)
CO2: 22 mmol/L (ref 20–29)
Calcium: 9.4 mg/dL (ref 8.7–10.2)
Chloride: 101 mmol/L (ref 96–106)
Creatinine, Ser: 1.1 mg/dL (ref 0.76–1.27)
Globulin, Total: 2.6 g/dL (ref 1.5–4.5)
Glucose: 98 mg/dL (ref 70–99)
Potassium: 4.6 mmol/L (ref 3.5–5.2)
Sodium: 138 mmol/L (ref 134–144)
Total Protein: 7.3 g/dL (ref 6.0–8.5)
eGFR: 80 mL/min/1.73 (ref >59)

## 2023-10-03 LAB — LIPID PANEL W/O CHOL/HDL RATIO
Cholesterol, Total: 187 mg/dL (ref 100–199)
HDL: 38 mg/dL — ABNORMAL LOW (ref >39)
LDL Chol Calc (NIH): 106 mg/dL — ABNORMAL HIGH (ref 0–99)
Triglycerides: 251 mg/dL — ABNORMAL HIGH (ref 0–149)
VLDL Cholesterol Cal: 43 mg/dL — ABNORMAL HIGH (ref 5–40)

## 2023-10-03 LAB — HEPATITIS B SURFACE ANTIBODY, QUANTITATIVE: Hepatitis B Surf Ab Quant: <3.5 m[IU]/mL — ABNORMAL LOW

## 2023-10-06 ENCOUNTER — Ambulatory Visit: Payer: Self-pay | Admitting: Family Medicine

## 2024-04-05 ENCOUNTER — Encounter: Admitting: Family Medicine
